# Patient Record
Sex: Male | Born: 1995 | Race: White | Hispanic: No | Marital: Single | Smoking: Never smoker
Health system: Southern US, Community
[De-identification: ages and names within clinical notes are randomized; demographics above are authoritative.]

## PROBLEM LIST (undated history)

## (undated) DIAGNOSIS — J45909 Unspecified asthma, uncomplicated: Secondary | ICD-10-CM

## (undated) HISTORY — PX: HIP SURGERY: SHX245

## (undated) HISTORY — PX: WISDOM TOOTH EXTRACTION: SHX21

---
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
## 2016-08-14 ENCOUNTER — Emergency Department (HOSPITAL_COMMUNITY)
Admission: EM | Admit: 2016-08-14 | Discharge: 2016-08-14 | Disposition: A | Discharge status: Other Acute Inpatient Hospital | Payer: BLUE CROSS/BLUE SHIELD | Attending: Physician Assistant | Admitting: Physician Assistant

## 2016-08-14 ENCOUNTER — Emergency Department (HOSPITAL_COMMUNITY): Payer: BLUE CROSS/BLUE SHIELD

## 2016-08-14 ENCOUNTER — Encounter (HOSPITAL_COMMUNITY): Payer: Self-pay

## 2016-08-14 DIAGNOSIS — Y999 Unspecified external cause status: Secondary | ICD-10-CM | POA: Diagnosis not present

## 2016-08-14 DIAGNOSIS — S80811A Abrasion, right lower leg, initial encounter: Secondary | ICD-10-CM | POA: Diagnosis not present

## 2016-08-14 DIAGNOSIS — M25551 Pain in right hip: Secondary | ICD-10-CM | POA: Insufficient documentation

## 2016-08-14 DIAGNOSIS — Z5181 Encounter for therapeutic drug level monitoring: Secondary | ICD-10-CM | POA: Diagnosis not present

## 2016-08-14 DIAGNOSIS — S73004A Unspecified dislocation of right hip, initial encounter: Secondary | ICD-10-CM | POA: Diagnosis not present

## 2016-08-14 DIAGNOSIS — J45909 Unspecified asthma, uncomplicated: Secondary | ICD-10-CM | POA: Insufficient documentation

## 2016-08-14 DIAGNOSIS — R52 Pain, unspecified: Secondary | ICD-10-CM

## 2016-08-14 DIAGNOSIS — Z79899 Other long term (current) drug therapy: Secondary | ICD-10-CM | POA: Diagnosis not present

## 2016-08-14 DIAGNOSIS — S0990XA Unspecified injury of head, initial encounter: Secondary | ICD-10-CM | POA: Diagnosis present

## 2016-08-14 DIAGNOSIS — Z23 Encounter for immunization: Secondary | ICD-10-CM | POA: Diagnosis not present

## 2016-08-14 DIAGNOSIS — Y939 Activity, unspecified: Secondary | ICD-10-CM | POA: Insufficient documentation

## 2016-08-14 DIAGNOSIS — Y92410 Unspecified street and highway as the place of occurrence of the external cause: Secondary | ICD-10-CM | POA: Diagnosis not present

## 2016-08-14 HISTORY — DX: Unspecified asthma, uncomplicated: J45.909

## 2016-08-14 LAB — CBC WITH DIFFERENTIAL/PLATELET
BASOS ABS: 0 10*3/uL (ref 0.0–0.1)
BASOS PCT: 0 %
EOS PCT: 2 %
Eosinophils Absolute: 0.2 10*3/uL (ref 0.0–0.7)
HCT: 43 % (ref 39.0–52.0)
Hemoglobin: 14.7 g/dL (ref 13.0–17.0)
LYMPHS PCT: 27 %
Lymphs Abs: 2.6 10*3/uL (ref 0.7–4.0)
MCH: 28 pg (ref 26.0–34.0)
MCHC: 34.2 g/dL (ref 30.0–36.0)
MCV: 81.9 fL (ref 78.0–100.0)
MONO ABS: 0.8 10*3/uL (ref 0.1–1.0)
Monocytes Relative: 8 %
Neutro Abs: 6 10*3/uL (ref 1.7–7.7)
Neutrophils Relative %: 63 %
Platelets: 225 10*3/uL (ref 150–400)
RBC: 5.25 MIL/uL (ref 4.22–5.81)
RDW: 13.2 % (ref 11.5–15.5)
WBC: 9.5 10*3/uL (ref 4.0–10.5)

## 2016-08-14 LAB — COMPREHENSIVE METABOLIC PANEL
ALBUMIN: 4.2 g/dL (ref 3.5–5.0)
ALT: 18 U/L (ref 17–63)
AST: 27 U/L (ref 15–41)
Alkaline Phosphatase: 59 U/L (ref 38–126)
Anion gap: 11 (ref 5–15)
BUN: 19 mg/dL (ref 6–20)
CHLORIDE: 105 mmol/L (ref 101–111)
CO2: 22 mmol/L (ref 22–32)
CREATININE: 1.06 mg/dL (ref 0.61–1.24)
Calcium: 9.1 mg/dL (ref 8.9–10.3)
GFR calc Af Amer: >60 mL/min (ref >60)
GLUCOSE: 121 mg/dL — AB (ref 65–99)
POTASSIUM: 3.4 mmol/L — AB (ref 3.5–5.1)
Sodium: 138 mmol/L (ref 135–145)
Total Bilirubin: 0.5 mg/dL (ref 0.3–1.2)
Total Protein: 6.7 g/dL (ref 6.5–8.1)

## 2016-08-14 LAB — PROTIME-INR
INR: 1.09
Prothrombin Time: 14.2 seconds (ref 11.4–15.2)

## 2016-08-14 MED ORDER — PROPOFOL 10 MG/ML IV BOLUS
1.0000 mg/kg | Freq: Once | INTRAVENOUS | Status: AC
  Administered 2016-08-14: 59 mg via INTRAVENOUS
  Filled 2016-08-14: qty 20

## 2016-08-14 MED ORDER — FENTANYL CITRATE (PF) 100 MCG/2ML IJ SOLN
INTRAMUSCULAR | Status: AC
  Filled 2016-08-14: qty 2

## 2016-08-14 MED ORDER — PROPOFOL BOLUS VIA INFUSION
1.0000 mg/kg | Freq: Once | INTRAVENOUS | Status: DC
  Filled 2016-08-14: qty 59

## 2016-08-14 MED ORDER — TETANUS-DIPHTH-ACELL PERTUSSIS 5-2.5-18.5 LF-MCG/0.5 IM SUSP
0.5000 mL | Freq: Once | INTRAMUSCULAR | Status: AC
  Administered 2016-08-15: 0.5 mL via INTRAMUSCULAR
  Filled 2016-08-14: qty 0.5

## 2016-08-14 MED ORDER — FENTANYL CITRATE (PF) 100 MCG/2ML IJ SOLN
INTRAMUSCULAR | Status: AC | PRN
  Administered 2016-08-14 (×2): 50 ug via INTRAVENOUS

## 2016-08-14 MED ORDER — PROPOFOL 10 MG/ML IV BOLUS
INTRAVENOUS | Status: AC | PRN
  Administered 2016-08-14: 10 mg via INTRAVENOUS
  Administered 2016-08-14 (×4): 30 mg via INTRAVENOUS

## 2016-08-14 NOTE — Progress Notes (Signed)
Orthopedic Tech Progress Note Patient Details:  Andria RheinRobert Manfre 12/20/1995 [id number]  Ortho Devices Type of Ortho Device: Knee Immobilizer Ortho Device/Splint Location: RLE Ortho Device/Splint Interventions: Ordered, Application   Jennye MoccasinHughes, Joan Avetisyan Craig 08/14/2016, 11:02 PM

## 2016-08-14 NOTE — Sedation Documentation (Signed)
Successful right hip reduction by Dr. Moody BruinsIrick and Dr. Corlis LeakMackuen, ortho at bedside placing a knee immobilizer, x ray at bedside
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
## 2016-08-14 NOTE — ED Notes (Signed)
Pt rolled onto his left side with blankets under him for support and comfort, pt is appreciative
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
## 2016-08-14 NOTE — ED Notes (Signed)
Transported to ct 

## 2016-08-14 NOTE — ED Notes (Signed)
REpaged ortho/XU 

## 2016-08-14 NOTE — ED Notes (Signed)
X-ray at bedside

## 2016-08-15 LAB — ETHANOL: Alcohol, Ethyl (B): <5 mg/dL

## 2016-08-15 MED ORDER — FENTANYL CITRATE (PF) 100 MCG/2ML IJ SOLN
50.0000 ug | Freq: Once | INTRAMUSCULAR | Status: AC
  Administered 2016-08-15: 50 ug via INTRAVENOUS
  Filled 2016-08-15: qty 2

## 2016-08-15 NOTE — ED Notes (Signed)
[facility] called for transport to [facility]- [facility]

## 2016-08-16 MED FILL — Fentanyl Citrate Preservative Free (PF) Inj 100 MCG/2ML: INTRAMUSCULAR | Qty: 2 | Status: AC

## 2017-02-04 ENCOUNTER — Emergency Department (HOSPITAL_BASED_OUTPATIENT_CLINIC_OR_DEPARTMENT_OTHER)
Admission: EM | Admit: 2017-02-04 | Discharge: 2017-02-04 | Disposition: A | Discharge status: Home / Self Care | Payer: BLUE CROSS/BLUE SHIELD | Attending: Emergency Medicine | Admitting: Emergency Medicine

## 2017-02-04 ENCOUNTER — Encounter (HOSPITAL_BASED_OUTPATIENT_CLINIC_OR_DEPARTMENT_OTHER): Payer: Self-pay

## 2017-02-04 DIAGNOSIS — Z5321 Procedure and treatment not carried out due to patient leaving prior to being seen by health care provider: Secondary | ICD-10-CM | POA: Insufficient documentation

## 2017-02-04 DIAGNOSIS — T7840XA Allergy, unspecified, initial encounter: Secondary | ICD-10-CM | POA: Diagnosis not present

## 2017-02-04 NOTE — ED Triage Notes (Addendum)
Pt states he had allergic reaction from tea this am approx 11am with swelling to upper lip-pt has taken benadryl  x 4 and 30cc liquid-pt states he woke from nap and felt like he could not breathe-pt NAD-anxious with hands shaking-no resp distress at this time and no swelling to lips noted-RT in triage for assessment

## 2017-03-17 ENCOUNTER — Emergency Department (HOSPITAL_COMMUNITY)
Admission: EM | Admit: 2017-03-17 | Discharge: 2017-03-17 | Disposition: A | Discharge status: Home / Self Care | Payer: Worker's Compensation | Attending: Emergency Medicine | Admitting: Emergency Medicine

## 2017-03-17 ENCOUNTER — Encounter (HOSPITAL_COMMUNITY): Payer: Self-pay

## 2017-03-17 ENCOUNTER — Emergency Department (HOSPITAL_COMMUNITY): Payer: Worker's Compensation

## 2017-03-17 DIAGNOSIS — Y939 Activity, unspecified: Secondary | ICD-10-CM | POA: Diagnosis not present

## 2017-03-17 DIAGNOSIS — Y929 Unspecified place or not applicable: Secondary | ICD-10-CM | POA: Insufficient documentation

## 2017-03-17 DIAGNOSIS — L600 Ingrowing nail: Secondary | ICD-10-CM

## 2017-03-17 DIAGNOSIS — W230XXA Caught, crushed, jammed, or pinched between moving objects, initial encounter: Secondary | ICD-10-CM | POA: Diagnosis not present

## 2017-03-17 DIAGNOSIS — S92411A Displaced fracture of proximal phalanx of right great toe, initial encounter for closed fracture: Secondary | ICD-10-CM | POA: Diagnosis not present

## 2017-03-17 DIAGNOSIS — Y990 Civilian activity done for income or pay: Secondary | ICD-10-CM | POA: Diagnosis not present

## 2017-03-17 DIAGNOSIS — S92401B Displaced unspecified fracture of right great toe, initial encounter for open fracture: Secondary | ICD-10-CM

## 2017-03-17 DIAGNOSIS — J45909 Unspecified asthma, uncomplicated: Secondary | ICD-10-CM | POA: Diagnosis not present

## 2017-03-17 MED ORDER — CEPHALEXIN 500 MG PO CAPS: 500.0000 mg | ORAL_CAPSULE | Freq: Four times a day (QID) | ORAL | 0 refills | Status: DC

## 2017-03-17 NOTE — ED Notes (Addendum)
Pt is alert and oriented x 4 and is accompanied with mother.Pt right big toe that is red and has scant bleeding  from nailbed, that appears to be split.  pt has pain with movement of toe 10/10 throbbing. Declined ice

## 2017-03-17 NOTE — ED Provider Notes (Signed)
[facility]-EMERGENCY DEPT Provider Note   CSN: [id number] Arrival date & time: 03/17/17  1013     History   Chief Complaint Chief Complaint  Patient presents with  . Foot Injury    HPI Timothy Ball is a 21 y.o. male.  Pt comes in with c/o right great toe pain after dropping a industrial battery on his toe at work a short time ago. He states that he already had an ingrown nail to the area but not the area is bleeding. Tetanus is utd. Denies numbness. No previous injury to the toe      Past Medical History:  Diagnosis Date  . Asthma     There are no active problems to display for this patient.   Past Surgical History:  Procedure Laterality Date  . HIP SURGERY         Home Medications    Prior to Admission medications   Medication Sig Start Date End Date Taking? Authorizing Provider  cephALEXin (KEFLEX) 500 MG capsule Take 1 capsule (500 mg total) by mouth 4 (four) times daily. 03/17/17   Teressa Lower, NP    Family History History reviewed. No pertinent family history.  Social History Social History  Substance Use Topics  . Smoking status: Never Smoker  . Smokeless tobacco: Never Used  . Alcohol use Yes     Comment: occ     Allergies   Peanut-containing drug products; Shellfish allergy; and Sulfites   Review of Systems Review of Systems  All other systems reviewed and are negative.    Physical Exam Updated Vital Signs BP 116/66 (BP Location: Left Arm)   Pulse 66   Temp 98.3 F (36.8 C) (Oral)   Resp 20   Ht 5\' 8"  (1.727 m)   Wt 59 kg (130 lb)   SpO2 100%   BMI 19.77 kg/m   Physical Exam  Constitutional: He is oriented to person, place, and time. He appears well-developed and well-nourished.  Cardiovascular: Normal rate.   Pulmonary/Chest: Effort normal.  Musculoskeletal:  Generalized pain to the right great toe. Mild swelling noted  Neurological: He is alert and oriented to person, place, and time.    Skin:  Overgrown skin noted on both side of right great toe.bleeding noted. No break in the nail  Nursing note and vitals reviewed.    ED Treatments / Results  Labs (all labs ordered are listed, but only abnormal results are displayed) Labs Reviewed - No data to display  EKG  EKG Interpretation None       Radiology Dg Foot Complete Right  Result Date: 03/17/2017 CLINICAL DATA:  Dropped heavy object on foot with pain, initial encounter EXAM: RIGHT FOOT COMPLETE - 3+ VIEW COMPARISON:  None. FINDINGS: Minimally displaced distal phalangeal tuft fracture is noted in the first distal phalanx. No other bony abnormality is seen. Mild soft tissue swelling is noted. IMPRESSION: Phalangeal tuft fracture in the first distal phalanx. Electronically Signed   By: Alcide Clever M.D.   On: 03/17/2017 10:47    Procedures Procedures (including critical care time)  Medications Ordered in ED Medications - No data to display   Initial Impression / Assessment and Plan / ED Course  I have reviewed the triage vital signs and the nursing notes.  Pertinent labs & imaging results that were available during my care of the patient were reviewed by me and considered in my medical decision making (see chart for details).     Pt refusing fixing  [place] nail. Will treat as possible open fracture. Splinted and treated with keflex  Final Clinical Impressions(s) / ED Diagnoses   Final diagnoses:  Open displaced fracture of phalanx of right great toe, unspecified phalanx, initial encounter  Ingrown nail of fifth toe of right foot    New Prescriptions Discharge Medication List as of 03/17/2017 11:39 AM    START taking these medications   Details  cephALEXin (KEFLEX) 500 MG capsule Take 1 capsule (500 mg total) by mouth 4 (four) times daily., Starting Mon 03/17/2017, Print         Rubin PayorPickering, Deliah BostonVrinda, NP 03/17/17 1221    Shaune PollackIsaacs, Cameron, MD 03/18/17 1149

## 2017-03-17 NOTE — ED Notes (Signed)
Buddy taped toes, applied post op shoe.

## 2017-03-17 NOTE — ED Triage Notes (Signed)
Per EMS, Pt, from work, c/o R foot injury after a battery fell on his foot.  Pain score 8/10.  Pt reports the battery weighs around 65lbs.  Toe nail is split on R great toe.

## 2017-03-17 NOTE — ED Notes (Signed)
Bed: WTR8 Expected date:  Expected time:  Means of arrival:  Comments: 21 yo m right foot injury

## 2017-05-08 ENCOUNTER — Other Ambulatory Visit: Payer: Self-pay | Admitting: Physician Assistant

## 2017-05-08 DIAGNOSIS — R1011 Right upper quadrant pain: Secondary | ICD-10-CM

## 2017-05-08 DIAGNOSIS — R1013 Epigastric pain: Secondary | ICD-10-CM

## 2017-05-15 ENCOUNTER — Ambulatory Visit
Admission: RE | Admit: 2017-05-15 | Discharge: 2017-05-15 | Disposition: A | Discharge status: Home / Self Care | Payer: BLUE CROSS/BLUE SHIELD | Source: Ambulatory Visit | Attending: Physician Assistant | Admitting: Physician Assistant

## 2017-05-15 DIAGNOSIS — R1013 Epigastric pain: Secondary | ICD-10-CM

## 2017-05-15 DIAGNOSIS — R1011 Right upper quadrant pain: Secondary | ICD-10-CM

## 2017-08-30 ENCOUNTER — Other Ambulatory Visit: Payer: Self-pay

## 2017-08-30 ENCOUNTER — Emergency Department (HOSPITAL_COMMUNITY)
Admission: EM | Admit: 2017-08-30 | Discharge: 2017-08-30 | Disposition: A | Discharge status: Home / Self Care | Payer: BLUE CROSS/BLUE SHIELD | Attending: Emergency Medicine | Admitting: Emergency Medicine

## 2017-08-30 ENCOUNTER — Encounter (HOSPITAL_COMMUNITY): Payer: Self-pay | Admitting: Emergency Medicine

## 2017-08-30 DIAGNOSIS — Z79899 Other long term (current) drug therapy: Secondary | ICD-10-CM | POA: Insufficient documentation

## 2017-08-30 DIAGNOSIS — T7840XA Allergy, unspecified, initial encounter: Secondary | ICD-10-CM

## 2017-08-30 DIAGNOSIS — J45909 Unspecified asthma, uncomplicated: Secondary | ICD-10-CM | POA: Insufficient documentation

## 2017-08-30 MED ORDER — FAMOTIDINE IN NACL 20-0.9 MG/50ML-% IV SOLN
20.0000 mg | Freq: Once | INTRAVENOUS | Status: AC
  Administered 2017-08-30: 20 mg via INTRAVENOUS
  Filled 2017-08-30: qty 50

## 2017-08-30 MED ORDER — SODIUM CHLORIDE 0.9 % IV SOLN
INTRAVENOUS | Status: DC
  Administered 2017-08-30: 12:00:00 via INTRAVENOUS

## 2017-08-30 MED ORDER — DIPHENHYDRAMINE HCL 25 MG PO TABS: 25.0000 mg | ORAL_TABLET | Freq: Four times a day (QID) | ORAL | 0 refills | Status: DC

## 2017-08-30 MED ORDER — FAMOTIDINE 20 MG PO TABS: 20.0000 mg | ORAL_TABLET | Freq: Two times a day (BID) | ORAL | 0 refills | Status: DC

## 2017-08-30 MED ORDER — PREDNISONE 10 MG PO TABS: 40.0000 mg | ORAL_TABLET | Freq: Every day | ORAL | 0 refills | Status: DC

## 2017-08-30 MED ORDER — SODIUM CHLORIDE 0.9 % IV BOLUS
1000.0000 mL | Freq: Once | INTRAVENOUS | Status: AC
  Administered 2017-08-30: 1000 mL via INTRAVENOUS

## 2017-08-30 MED ORDER — METHYLPREDNISOLONE SODIUM SUCC 125 MG IJ SOLR
125.0000 mg | Freq: Once | INTRAMUSCULAR | Status: AC
Start: 2017-08-30 — End: 2017-08-30
  Administered 2017-08-30: 125 mg via INTRAVENOUS
  Filled 2017-08-30: qty 2

## 2017-08-30 NOTE — ED Notes (Signed)
Patient verbalizes understanding of discharge instructions. Opportunity for questioning and answers were provided. Armband removed by staff, pt discharged from ED.  

## 2017-08-30 NOTE — ED Triage Notes (Signed)
Pt. Stated, I got up and ate some vanilla wafers and then my face got swollen and I took 2 Benadryl's and Im sorted SOB and its feels hard to swallow. This started at 1030 and its the same. I did get a tattoo yesterday and was given an ointment , that's the only thing new. Pt. Just stated, I think Im allergic to Benadryl

## 2017-08-30 NOTE — Discharge Instructions (Addendum)
Continue the prednisone for the next 5 days.  Continue Benadryl for the next 2 days and the Pepcid for the next 7 days.  Would avoid the cookies that seem to be related to today's reaction.  Return for any new or worse symptoms.

## 2017-08-30 NOTE — ED Provider Notes (Signed)
[facility] EMERGENCY [facility]PARTMENT Provider Note   CSN: [id number] Arrival date & time: 08/30/17  1058     History   Chief Complaint Chief Complaint  Patient presents with  . Allergic Reaction  . Shortness of Breath  . Dysphagia    HPI Timothy Ball is a 22 y.o. male.  Patient with known history of several allergies.  Was eating but no a cookie is which normally he does fine with and started to feel as if he had swelling to the right side of his face some swelling to the tongue and some difficulty swallowing.  Patient took 50 mg Benadryl at home prior to arrival.  Only thing else it is new patient had a tattoo placed yesterday and has been using a special skin cream for that but was using that yesterday without any difficulty.     Past Medical History:  Diagnosis Date  . Asthma     There are no active problems to display for this patient.   Past Surgical History:  Procedure Laterality Date  . HIP SURGERY          Home Medications    Prior to Admission medications   Medication Sig Start Date End Date Taking? Authorizing Provider  acetaminophen (TYLENOL) 500 MG tablet Take 1,000 mg by mouth as needed for mild pain.   Yes [provider]  albuterol (PROVENTIL HFA;VENTOLIN HFA) 108 (90 Base) MCG/ACT inhaler Inhale 1 puff into the lungs every 6 (six) hours as needed for shortness of breath or wheezing. 02/06/17  Yes [provider]  hydrOXYzine (ATARAX/VISTARIL) 25 MG tablet Take 25 mg by mouth every 8 (eight) hours as needed for anxiety. 08/25/17  Yes [provider]  omeprazole (PRILOSEC OTC) 20 MG tablet Take 20 mg by mouth daily.   Yes [provider]  QUEtiapine (SEROQUEL) 50 MG tablet Take 50 mg by mouth daily. 08/25/17  Yes [provider]  cephALEXin (KEFLEX) 500 MG capsule Take 1 capsule (500 mg total) by mouth 4 (four) times daily. Patient not taking: Reported on 08/30/2017 03/17/17   Teressa LowerPickering, Vrinda,  NP  diphenhydrAMINE (BENADRYL) 25 MG tablet Take 1 tablet (25 mg total) by mouth every 6 (six) hours for 2 days. 08/30/17 09/01/17  Vanetta MuldersZackowski, Jeniyah Menor, MD  famotidine (PEPCID) 20 MG tablet Take 1 tablet (20 mg total) by mouth 2 (two) times daily. 08/30/17   Vanetta MuldersZackowski, Hanson Medeiros, MD  predniSONE (DELTASONE) 10 MG tablet Take 4 tablets (40 mg total) by mouth daily. 08/30/17   Vanetta MuldersZackowski, Doneta Bayman, MD    Family History No family history on file.  Social History Social History   Tobacco Use  . Smoking status: Never Smoker  . Smokeless tobacco: Never Used  Substance Use Topics  . Alcohol use: Yes    Comment: occ  . Drug use: No     Allergies   Peanut oil; Peanut-containing drug products; Shellfish allergy; Prednisone; Sulfites; and Benadryl [diphenhydramine]   Review of Systems Review of Systems  Constitutional: Negative for fever.  HENT: Positive for facial swelling and trouble swallowing. Negative for congestion and voice change.   Eyes: Negative for redness.  Respiratory: Negative for shortness of breath and wheezing.   Cardiovascular: Negative for chest pain.  Gastrointestinal: Negative for abdominal pain, nausea and vomiting.  Musculoskeletal: Negative for back pain.  Skin: Negative for rash.  Neurological: Positive for facial asymmetry. Negative for syncope.  Hematological: Does not bruise/bleed easily.  Psychiatric/Behavioral: Negative for confusion.  Physical Exam Updated Vital Signs BP 111/68   Pulse 76   Temp (!) 97.5 F (36.4 C) (Oral)   Resp 14   Ht 1.753 m (5\' 9" )   Wt 56.7 kg (125 lb)   SpO2 100%   BMI 18.46 kg/m   Physical Exam  Constitutional: He is oriented to person, place, and time. He appears well-developed and well-nourished. No distress.  HENT:  Head: Normocephalic and atraumatic.  Mouth/Throat: Oropharynx is clear and moist.  No obvious facial swelling or tongue swelling.  Eyes: Pupils are equal, round, and reactive to light. Conjunctivae and EOM are  normal.  Neck: Normal range of motion. Neck supple.  Cardiovascular: Normal rate, regular rhythm and normal heart sounds.  Pulmonary/Chest: Effort normal and breath sounds normal. No respiratory distress. He has no wheezes.  Abdominal: Soft. Bowel sounds are normal. There is no tenderness.  Musculoskeletal: Normal range of motion. He exhibits no edema.  Neurological: He is alert and oriented to person, place, and time. No cranial nerve deficit or sensory deficit. He exhibits normal muscle tone. Coordination normal.  Skin: Skin is warm.  Nursing note and vitals reviewed.    ED Treatments / Results  Labs (all labs ordered are listed, but only abnormal results are displayed) Labs Reviewed - No data to display  EKG None  Radiology No results found.  Procedures Procedures (including critical care time)  Medications Ordered in ED Medications  0.9 %  sodium chloride infusion ( Intravenous New Bag/Given 08/30/17 1147)  sodium chloride 0.9 % bolus 1,000 mL (0 mLs Intravenous Stopped 08/30/17 1346)  methylPREDNISolone sodium succinate (SOLU-MEDROL) 125 mg/2 mL injection 125 mg (125 mg Intravenous Given 08/30/17 1145)  famotidine (PEPCID) IVPB 20 mg premix (0 mg Intravenous Stopped 08/30/17 1346)     Initial Impression / Assessment and Plan / ED Course  I have reviewed the triage vital signs and the nursing notes.  Pertinent labs & imaging results that were available during my care of the patient were reviewed by me and considered in my medical decision making (see chart for details).    Patient with known history of several allergies.  Patient believes that the reaction occurred while he was eating Salmonella cookies usually been able to eat those in the past.  He got a tight feeling in the throat felt as if his tongue is swollen some and and had some swelling to the right side of his face.  No hives.  Patient here received Solu-Medrol and Pepcid.  He already taken 50 mg of Benadryl at  home.  Patient now asymptomatic.  Never had any wheezing.  No visible or significant tongue swelling just subtext subjective tongue swelling.  Patient stable for discharge home.  Will take prednisone for the next 5 days.  Will take Pepcid for the next 7 days.  And will continue Benadryl for the next 2 days.  He will follow-up with his primary care provider.   Final Clinical Impressions(s) / ED Diagnoses   Final diagnoses:  Allergic reaction, initial encounter    ED Discharge Orders        Ordered    predniSONE (DELTASONE) 10 MG tablet  Daily     08/30/17 1416    diphenhydrAMINE (BENADRYL) 25 MG tablet  Every 6 hours     08/30/17 1416    famotidine (PEPCID) 20 MG tablet  2 times daily     08/30/17 1416       Vanetta Mulders, MD 08/30/17 1422

## 2018-01-08 ENCOUNTER — Encounter (HOSPITAL_COMMUNITY): Payer: Self-pay

## 2018-01-08 ENCOUNTER — Other Ambulatory Visit: Payer: Self-pay

## 2018-01-08 ENCOUNTER — Emergency Department (HOSPITAL_COMMUNITY)
Admission: EM | Admit: 2018-01-08 | Discharge: 2018-01-08 | Disposition: A | Discharge status: Home / Self Care | Payer: BLUE CROSS/BLUE SHIELD | Attending: Emergency Medicine | Admitting: Emergency Medicine

## 2018-01-08 DIAGNOSIS — Z79899 Other long term (current) drug therapy: Secondary | ICD-10-CM | POA: Insufficient documentation

## 2018-01-08 DIAGNOSIS — R070 Pain in throat: Secondary | ICD-10-CM | POA: Insufficient documentation

## 2018-01-08 DIAGNOSIS — R112 Nausea with vomiting, unspecified: Secondary | ICD-10-CM | POA: Insufficient documentation

## 2018-01-08 DIAGNOSIS — T887XXA Unspecified adverse effect of drug or medicament, initial encounter: Secondary | ICD-10-CM | POA: Diagnosis present

## 2018-01-08 DIAGNOSIS — F411 Generalized anxiety disorder: Secondary | ICD-10-CM

## 2018-01-08 DIAGNOSIS — Z91010 Allergy to peanuts: Secondary | ICD-10-CM | POA: Diagnosis not present

## 2018-01-08 DIAGNOSIS — F410 Panic disorder [episodic paroxysmal anxiety] without agoraphobia: Secondary | ICD-10-CM

## 2018-01-08 DIAGNOSIS — J45909 Unspecified asthma, uncomplicated: Secondary | ICD-10-CM | POA: Insufficient documentation

## 2018-01-08 DIAGNOSIS — Y658 Other specified misadventures during surgical and medical care: Secondary | ICD-10-CM | POA: Diagnosis not present

## 2018-01-08 DIAGNOSIS — T50905A Adverse effect of unspecified drugs, medicaments and biological substances, initial encounter: Secondary | ICD-10-CM | POA: Diagnosis not present

## 2018-01-08 DIAGNOSIS — R42 Dizziness and giddiness: Secondary | ICD-10-CM | POA: Diagnosis not present

## 2018-01-08 DIAGNOSIS — R0789 Other chest pain: Secondary | ICD-10-CM | POA: Diagnosis not present

## 2018-01-08 MED ORDER — ESCITALOPRAM OXALATE 10 MG PO TABS: 10.0000 mg | ORAL_TABLET | Freq: Every day | ORAL | 0 refills | Status: AC

## 2018-01-08 MED ORDER — HYDROXYZINE PAMOATE 50 MG PO CAPS: 50.0000 mg | ORAL_CAPSULE | Freq: Three times a day (TID) | ORAL | 0 refills | Status: AC | PRN

## 2018-01-08 NOTE — Discharge Instructions (Addendum)
I recommend that you stop taking the Buspirone (Buspar). I have placed this as an allergy in the chart.   As we discussed, the best treatment for anxiety are SSRIs (such as Zoloft, Lexapro, Prozac). These take at least 2-4 weeks to see a difference. I have prescribed you Lexapro 10mg  which you can take during the day or at night. I have also prescribed you Vistaril 50mg  which is for the acute anxiety attacks. This medication is best taken before the panic attack occurs. I recommend that you take this scheduled three times a day or a couple of hours before bedtime.  Call your PCP soon to schedule a follow-up appointment soon.

## 2018-01-08 NOTE — ED Provider Notes (Signed)
[facility]-EMERGENCY DEPT Provider Note  CSN: [id number] Arrival date & time: 01/08/18  1235  History   Chief Complaint Chief Complaint  Patient presents with  . Medication Reaction  . Emesis    HPI Timothy Ball is a 22 y.o. male with a medical history of asthma and GAD with panic attacks who presented to the ED for medication reaction. He was prescribed Buspar by his PCP yesterday 01/07/18 for anxiety. He reports nausea after taking first dose last night. After taking his morning dose today at work, he reports nausea, vomiting, dizziness and chest and throat tightness. Denies oral swelling, palpitations, abdominal pain, chest pain or skin rashes. Patient received IV Zofran in the EMS prior to arrival. Patient currently asymptomatic and has no current physical complaints.   Past Medical History:  Diagnosis Date  . Asthma     There are no active problems to display for this patient.   Past Surgical History:  Procedure Laterality Date  . HIP SURGERY    . WISDOM TOOTH EXTRACTION          Home Medications    Prior to Admission medications   Medication Sig Start Date End Date Taking? Authorizing Provider  acetaminophen (TYLENOL) 500 MG tablet Take 1,000 mg by mouth as needed for mild pain.   Yes [provider]  albuterol (PROVENTIL HFA;VENTOLIN HFA) 108 (90 Base) MCG/ACT inhaler Inhale 1 puff into the lungs every 6 (six) hours as needed for shortness of breath or wheezing. 02/06/17  Yes [provider]  busPIRone (BUSPAR) 15 MG tablet Take 15 mg by mouth 2 (two) times daily. 01/05/18  Yes [provider]  cephALEXin (KEFLEX) 500 MG capsule Take 1 capsule (500 mg total) by mouth 4 (four) times daily. Patient not taking: Reported on 08/30/2017 03/17/17   Teressa Lower, NP  diphenhydrAMINE (BENADRYL) 25 MG tablet Take 1 tablet (25 mg total) by mouth every 6 (six) hours for 2 days. Patient not taking: Reported on 01/08/2018 08/30/17  09/01/17  Vanetta Mulders, MD  escitalopram (LEXAPRO) 10 MG tablet Take 1 tablet (10 mg total) by mouth daily. 01/08/18 02/07/18  Sarh Kirschenbaum, Jerrel Ivory I, PA-C  famotidine (PEPCID) 20 MG tablet Take 1 tablet (20 mg total) by mouth 2 (two) times daily. Patient not taking: Reported on 01/08/2018 08/30/17   Vanetta Mulders, MD  hydrOXYzine (VISTARIL) 50 MG capsule Take 1 capsule (50 mg total) by mouth 3 (three) times daily as needed for up to 14 days. 01/08/18 01/22/18  Gwenda Heiner, Jerrel Ivory I, PA-C  predniSONE (DELTASONE) 10 MG tablet Take 4 tablets (40 mg total) by mouth daily. Patient not taking: Reported on 01/08/2018 08/30/17   Vanetta Mulders, MD    Family History History reviewed. No pertinent family history.  Social History Social History   Tobacco Use  . Smoking status: Never Smoker  . Smokeless tobacco: Never Used  Substance Use Topics  . Alcohol use: Yes    Comment: occ  . Drug use: No     Allergies   Buspirone; Peanut oil; Peanut-containing drug products; Shellfish allergy; Prednisone; Sulfites; and Benadryl [diphenhydramine]   Review of Systems Review of Systems  Constitutional: Negative for chills and fever.  HENT: Negative for congestion, facial swelling and sinus pain.        Throat tightness  Eyes: Negative for visual disturbance.  Respiratory: Positive for chest tightness. Negative for cough, choking and shortness of breath.   Cardiovascular: Negative for chest pain, palpitations and leg swelling.  Gastrointestinal: Positive  for nausea and vomiting. Negative for abdominal pain.  Genitourinary: Negative.   Musculoskeletal: Negative.   Skin: Negative for rash.  Neurological: Positive for dizziness and light-headedness. Negative for weakness and numbness.  Psychiatric/Behavioral: The patient is nervous/anxious.      Physical Exam Updated Vital Signs BP (!) 101/57 (BP Location: Right Arm)   Pulse 75   Temp 98.5 F (36.9 C) (Oral)   Resp 18   Ht 5' 8.5" (1.74 m)   Wt  59 kg   SpO2 100%   BMI 19.48 kg/m   Physical Exam  Constitutional: Vital signs are normal. He appears well-developed and well-nourished. He is cooperative. No distress.  HENT:  Head: Normocephalic and atraumatic.  Mouth/Throat: Uvula is midline, oropharynx is clear and moist and mucous membranes are normal.  Eyes: Pupils are equal, round, and reactive to light. Conjunctivae, EOM and lids are normal.  Neck: Trachea normal, normal range of motion, full passive range of motion without pain and phonation normal. Neck supple.  Cardiovascular: Normal rate, regular rhythm, normal heart sounds, intact distal pulses and normal pulses.  Pulmonary/Chest: Effort normal and breath sounds normal.  Abdominal: Soft. Normal appearance and bowel sounds are normal. There is no tenderness.  Neurological: He is alert.  Skin: Skin is warm and intact. Capillary refill takes less than 2 seconds. No rash noted. Rash is not urticarial.  Psychiatric: His speech is normal. His mood appears anxious.  Nursing note and vitals reviewed.  ED Treatments / Results  Labs (all labs ordered are listed, but only abnormal results are displayed) Labs Reviewed - No data to display  EKG None  Radiology No results found.  Procedures Procedures (including critical care time)  Medications Ordered in ED Medications - No data to display   Initial Impression / Assessment and Plan / ED Course  Triage vital signs and the nursing notes have been reviewed.  Pertinent labs & imaging results that were available during care of the patient were reviewed and considered in medical decision making (see chart for details).  Patient presents following a suspected allergic reaction to Buspar. Patient first began taking this medication yesterday from his PCP for anxiety and reports nausea, vomiting and chest tightness. Denies SOB, urticaria, oral swelling or other signs of anaphylaxis. He received Zofran prior to arrival and patient is  currently asymptomatic during evaluation. Physical exam is unremarkable which is reassuring.   Patient talks extensively about his anxiety and concerns for panic attacks. He describes daily attacks mostly at night where he has chest pain, increased worry, diaphoresis, dizziness and overwhelment. Education provided on medications indicated for panic attacks and GAD. He would likely benefit from SSRI plus an anxiolytic. He is not currently established with psychiatrist and past psych meds managed by PCP. He states that it takes 2-3 weeks for him to get an appointment with PCP. Patient reports trying Zoloft in the past, but was not consistent with it and did not max out dose. Will discharge patient with Lexapro (less GI side effects vs Zoloft) to begin treatment for anxiety since it takes 2-4 weeks for SSRIs to take effect which should bridge him to his next PCP appointment.   Final Clinical Impressions(s) / ED Diagnoses  1. Generalized Anxiety Disorder with Panic Attacks. Lexapro 10mg  QD for GAD. Vistaril 50mg  TID PRN for acute anxiety attacks. Education provided on efficacy of Lexapro, Vistaril and long-term effects. Advised to follow-up with PCP in 2-4 weeks to discuss effectiveness of medication. 2. Medication Reaction. Advised to  stop Buspar. Listed as allergy in chart.   Dispo: Home. After thorough clinical evaluation, this patient is determined to be medically stable and can be safely discharged with the previously mentioned treatment and/or outpatient follow-up/referral(s). At this time, there are no other apparent medical conditions that require further screening, evaluation or treatment.   Final diagnoses:  Adverse effect of drug, initial encounter  Generalized anxiety disorder with panic attacks    ED Discharge Orders         Ordered    escitalopram (LEXAPRO) 10 MG tablet  Daily     01/08/18 1628    hydrOXYzine (VISTARIL) 50 MG capsule  3 times daily PRN     01/08/18 1628             MortisSharyon Medicus, Kasson Lamere I, PA-C 01/08/18 2031    Virgina Norfolkuratolo, Adam, DO 01/09/18 [id number]

## 2018-01-08 NOTE — ED Triage Notes (Signed)
Patient reports tht he had an allergic reaction to Buspirone HCl. Patient states he last took at 1100 today. patient had a total of 2 tablets. Patient c/o vomiting, dizziness, and difficulty breathing.

## 2018-01-08 NOTE — ED Triage Notes (Signed)
He states he was rx with Buspar yesterday. Today has had several episodes of emesis. He rec'd. IV Zofran en route to hospital. He arrives in no distress.

## 2018-07-22 ENCOUNTER — Encounter (HOSPITAL_BASED_OUTPATIENT_CLINIC_OR_DEPARTMENT_OTHER): Payer: Self-pay

## 2018-07-22 ENCOUNTER — Emergency Department (HOSPITAL_BASED_OUTPATIENT_CLINIC_OR_DEPARTMENT_OTHER)
Admission: EM | Admit: 2018-07-22 | Discharge: 2018-07-23 | Disposition: A | Discharge status: Home / Self Care | Payer: BLUE CROSS/BLUE SHIELD | Attending: Emergency Medicine | Admitting: Emergency Medicine

## 2018-07-22 ENCOUNTER — Other Ambulatory Visit: Payer: Self-pay

## 2018-07-22 DIAGNOSIS — Z91010 Allergy to peanuts: Secondary | ICD-10-CM | POA: Insufficient documentation

## 2018-07-22 DIAGNOSIS — T782XXA Anaphylactic shock, unspecified, initial encounter: Secondary | ICD-10-CM | POA: Insufficient documentation

## 2018-07-22 DIAGNOSIS — J45909 Unspecified asthma, uncomplicated: Secondary | ICD-10-CM | POA: Insufficient documentation

## 2018-07-22 DIAGNOSIS — Z79899 Other long term (current) drug therapy: Secondary | ICD-10-CM | POA: Insufficient documentation

## 2018-07-22 DIAGNOSIS — R0602 Shortness of breath: Secondary | ICD-10-CM | POA: Diagnosis present

## 2018-07-22 MED ORDER — METHYLPREDNISOLONE SODIUM SUCC 125 MG IJ SOLR
125.0000 mg | Freq: Once | INTRAMUSCULAR | Status: AC
  Administered 2018-07-22: 125 mg via INTRAVENOUS
  Filled 2018-07-22: qty 2

## 2018-07-22 MED ORDER — RANITIDINE HCL 150 MG/10ML PO SYRP
150.0000 mg | ORAL_SOLUTION | Freq: Once | ORAL | Status: AC
  Administered 2018-07-22: 150 mg via ORAL
  Filled 2018-07-22: qty 10

## 2018-07-22 MED ORDER — DIPHENHYDRAMINE HCL 50 MG/ML IJ SOLN
25.0000 mg | Freq: Once | INTRAMUSCULAR | Status: AC
  Administered 2018-07-22: 25 mg via INTRAVENOUS
  Filled 2018-07-22: qty 1

## 2018-07-22 MED ORDER — EPINEPHRINE 0.3 MG/0.3ML IJ SOAJ
0.3000 mg | Freq: Once | INTRAMUSCULAR | Status: AC
  Administered 2018-07-22: 0.3 mg via INTRAMUSCULAR
  Filled 2018-07-22: qty 0.3

## 2018-07-22 NOTE — ED Notes (Signed)
Pt sleeping on stretcher. No hives noted. No SOB noted. Pt appears to be in NAD

## 2018-07-22 NOTE — ED Triage Notes (Addendum)
Pt states he is having throat tightness after drinking milk shake with m&m-allergy to peanuts-NAD-steady gait-pt did not use epi pen PTA

## 2018-07-22 NOTE — ED Notes (Signed)
Pt requesting top use the restroom. Pt refused urinal when offered. NAD at this time. VSS. Ambulatory to bathroom, gait equal and balanced.

## 2018-07-22 NOTE — ED Notes (Signed)
Pt given water for PO challenge 

## 2018-07-23 NOTE — ED Provider Notes (Signed)
[facility] EMERGENCY DEPARTMENT Provider Note   CSN: [id number] Arrival date & time: 07/22/18  2119    History   Chief Complaint Chief Complaint  Patient presents with  . Allergic Reaction    HPI Timothy Ball is a 23 y.o. male.     HPI   23 year old male with a history of asthma, anaphylaxis to peanuts, presents with concern for shortness of breath and throat swelling after having an m and m milkshake at [employer].    Patient reports that he had a couple sips of the milkshake, and suddenly developed sensation of shortness of breath and throat swelling.  Denies rash, nausea, vomiting, lightheadedness, change in voice.  Reports that he does have EpiPen's at home but he did not use it.  Reports in addition to the symptoms of shortness of breath, he does feel anxious with perioral tingling.  Past Medical History:  Diagnosis Date  . Asthma     There are no active problems to display for this patient.   Past Surgical History:  Procedure Laterality Date  . HIP SURGERY    . WISDOM TOOTH EXTRACTION          Home Medications    Prior to Admission medications   Medication Sig Start Date End Date Taking? Authorizing Provider  acetaminophen (TYLENOL) 500 MG tablet Take 1,000 mg by mouth as needed for mild pain.    [provider]  albuterol (PROVENTIL HFA;VENTOLIN HFA) 108 (90 Base) MCG/ACT inhaler Inhale 1 puff into the lungs every 6 (six) hours as needed for shortness of breath or wheezing. 02/06/17   [provider]  busPIRone (BUSPAR) 15 MG tablet Take 15 mg by mouth 2 (two) times daily. 01/05/18   [provider]  cephALEXin (KEFLEX) 500 MG capsule Take 1 capsule (500 mg total) by mouth 4 (four) times daily. Patient not taking: Reported on 08/30/2017 03/17/17   Teressa Lower, NP  diphenhydrAMINE (BENADRYL) 25 MG tablet Take 1 tablet (25 mg total) by mouth every 6 (six) hours for 2 days. Patient not taking: Reported on  01/08/2018 08/30/17 09/01/17  Vanetta Mulders, MD  escitalopram (LEXAPRO) 10 MG tablet Take 1 tablet (10 mg total) by mouth daily. 01/08/18 02/07/18  Mortis, Jerrel Ivory I, PA-C  famotidine (PEPCID) 20 MG tablet Take 1 tablet (20 mg total) by mouth 2 (two) times daily. Patient not taking: Reported on 01/08/2018 08/30/17   Vanetta Mulders, MD  predniSONE (DELTASONE) 10 MG tablet Take 4 tablets (40 mg total) by mouth daily. Patient not taking: Reported on 01/08/2018 08/30/17   Vanetta Mulders, MD    Family History No family history on file.  Social History Social History   Tobacco Use  . Smoking status: Never Smoker  . Smokeless tobacco: Never Used  Substance Use Topics  . Alcohol use: Yes    Comment: occ  . Drug use: No     Allergies   Buspirone; Peanut oil; Peanut-containing drug products; Shellfish allergy; Prednisone; Sulfites; and Benadryl [diphenhydramine]   Review of Systems Review of Systems  Constitutional: Negative for fever.  HENT: Positive for trouble swallowing. Negative for sore throat and voice change.   Eyes: Negative for visual disturbance.  Respiratory: Positive for shortness of breath.   Cardiovascular: Negative for chest pain.  Gastrointestinal: Negative for abdominal pain, nausea and vomiting.  Genitourinary: Negative for difficulty urinating.  Musculoskeletal: Negative for back pain and neck stiffness.  Skin: Negative for rash.  Neurological: Negative for syncope and headaches.  Physical Exam Updated Vital Signs BP 108/68   Pulse (!) 51   Resp 15   Ht 5\' 8"  (1.727 m)   Wt 61.1 kg   SpO2 98%   BMI 20.48 kg/m   Physical Exam Vitals signs and nursing note reviewed.  Constitutional:      General: He is not in acute distress.    Appearance: He is well-developed. He is not diaphoretic.     Comments: anxious  HENT:     Head: Normocephalic and atraumatic.  Eyes:     Conjunctiva/sclera: Conjunctivae normal.  Neck:     Musculoskeletal: Normal  range of motion.  Cardiovascular:     Rate and Rhythm: Normal rate and regular rhythm.     Heart sounds: Normal heart sounds. No murmur. No friction rub. No gallop.   Pulmonary:     Effort: Pulmonary effort is normal. Tachypnea present. No respiratory distress.     Breath sounds: Normal breath sounds. No wheezing or rales.     Comments: No stridor Abdominal:     General: There is no distension.     Palpations: Abdomen is soft.     Tenderness: There is no abdominal tenderness. There is no guarding.  Skin:    General: Skin is warm and dry.     Comments: No rash  Neurological:     Mental Status: He is alert and oriented to person, place, and time.      ED Treatments / Results  Labs (all labs ordered are listed, but only abnormal results are displayed) Labs Reviewed - No data to display  EKG None  Radiology No results found.  Procedures .Critical Care Performed by: Alvira Monday, MD Authorized by: Alvira Monday, MD   Critical care provider statement:    Critical care time (minutes):  30   Critical care was time spent personally by me on the following activities:  Examination of patient, re-evaluation of patient's condition, obtaining history from patient or surrogate and development of treatment plan with patient or surrogate   (including critical care time)  Medications Ordered in ED Medications  EPINEPHrine (EPI-PEN) injection 0.3 mg (0.3 mg Intramuscular Given 07/22/18 2204)  diphenhydrAMINE (BENADRYL) injection 25 mg (25 mg Intravenous Given 07/22/18 2155)  ranitidine (ZANTAC) 150 MG/10ML syrup 150 mg (150 mg Oral Given 07/22/18 2155)  methylPREDNISolone sodium succinate (SOLU-MEDROL) 125 mg/2 mL injection 125 mg (125 mg Intravenous Given 07/22/18 2155)     Initial Impression / Assessment and Plan / ED Course  I have reviewed the triage vital signs and the nursing notes.  Pertinent labs & imaging results that were available during my care of the patient were  reviewed by me and considered in my medical decision making (see chart for details).        23 year old male with a history of asthma, anaphylaxis to peanuts, presents with concern for shortness of breath and throat swelling after having an m and m milkshake at [employer].  Possible contamination with peanuts with milkshake, and symptoms described concerning for anaphylaxis given dyspnea, sensation of throat swelling.   Given epi, benadryl, solumedrol, zantac with improvement in symptoms.  Will observe for 4 hours in the ED, if no rebound of symptoms may be discharged.  He does report previous side effects with steroids/benadryl, however in setting of initial presentation felt benefits outweigh potential side effects, however do not feel he requires prescription to take these at home.  He also already has rx and epi pens. Care signed out  with reeval pending.    Final Clinical Impressions(s) / ED Diagnoses   Final diagnoses:  Anaphylaxis, initial encounter    ED Discharge Orders    None       Alvira Monday, MD 07/23/18 [phone]

## 2018-07-23 NOTE — ED Notes (Signed)
Pt denies any SOB, sore throat or allergic reaction symptoms

## 2018-07-31 ENCOUNTER — Emergency Department (HOSPITAL_BASED_OUTPATIENT_CLINIC_OR_DEPARTMENT_OTHER): Payer: BLUE CROSS/BLUE SHIELD

## 2018-07-31 ENCOUNTER — Emergency Department (HOSPITAL_BASED_OUTPATIENT_CLINIC_OR_DEPARTMENT_OTHER)
Admission: EM | Admit: 2018-07-31 | Discharge: 2018-07-31 | Disposition: A | Discharge status: Home / Self Care | Payer: BLUE CROSS/BLUE SHIELD | Attending: Emergency Medicine | Admitting: Emergency Medicine

## 2018-07-31 ENCOUNTER — Other Ambulatory Visit: Payer: Self-pay

## 2018-07-31 ENCOUNTER — Encounter (HOSPITAL_BASED_OUTPATIENT_CLINIC_OR_DEPARTMENT_OTHER): Payer: Self-pay

## 2018-07-31 DIAGNOSIS — Z91010 Allergy to peanuts: Secondary | ICD-10-CM | POA: Diagnosis not present

## 2018-07-31 DIAGNOSIS — J45909 Unspecified asthma, uncomplicated: Secondary | ICD-10-CM | POA: Insufficient documentation

## 2018-07-31 DIAGNOSIS — Z79899 Other long term (current) drug therapy: Secondary | ICD-10-CM | POA: Insufficient documentation

## 2018-07-31 DIAGNOSIS — R05 Cough: Secondary | ICD-10-CM | POA: Insufficient documentation

## 2018-07-31 DIAGNOSIS — R059 Cough, unspecified: Secondary | ICD-10-CM

## 2018-07-31 LAB — INFLUENZA PANEL BY PCR (TYPE A & B)
INFLAPCR: NEGATIVE
INFLBPCR: NEGATIVE

## 2018-07-31 LAB — GROUP A STREP BY PCR: Group A Strep by PCR: NOT DETECTED

## 2018-07-31 MED ORDER — BENZONATATE 100 MG PO CAPS: 100.0000 mg | ORAL_CAPSULE | Freq: Three times a day (TID) | ORAL | 0 refills | Status: DC

## 2018-07-31 MED ORDER — FLUTICASONE PROPIONATE 50 MCG/ACT NA SUSP: 1.0000 | Freq: Every day | NASAL | 0 refills | Status: DC

## 2018-07-31 MED ORDER — NAPROXEN 500 MG PO TABS: 500.0000 mg | ORAL_TABLET | Freq: Two times a day (BID) | ORAL | 0 refills | Status: DC

## 2018-07-31 MED ORDER — IPRATROPIUM-ALBUTEROL 0.5-2.5 (3) MG/3ML IN SOLN: 3.0000 mL | Freq: Once | RESPIRATORY_TRACT | Status: DC

## 2018-07-31 MED ORDER — ALBUTEROL SULFATE HFA 108 (90 BASE) MCG/ACT IN AERS
1.0000 | INHALATION_SPRAY | Freq: Once | RESPIRATORY_TRACT | Status: AC
  Administered 2018-07-31: 2 via RESPIRATORY_TRACT
  Filled 2018-07-31: qty 6.7

## 2018-07-31 NOTE — ED Provider Notes (Signed)
[facility] EMERGENCY DEPARTMENT Provider Note   CSN: [id number] Arrival date & time: 07/31/18  1131    History   Chief Complaint Chief Complaint  Patient presents with  . Cough    HPI Timothy Ball is a 23 y.o. male with a history of asthma who presents to the ED with complaints of URI/LRI sxs x 1.5 weeks. Patient reports congestion, bilateral ear pain, sore throat, cough productive of green mucous sputum, and at times he feels a bit short of breath with wheezing. He has a hx of asthma, has access to an inhaler at home, but has not been using it. He states sxs are constant without alleviating/aggravating factors. He notes subjective fevers, has not taken a temperature. Denies chest pain, emesis, diarrhea, or abdominal pain.  Denies recent foreign travel, sick contacts, or exposure to anyone formally diagnosed with coronavirus.       HPI  Past Medical History:  Diagnosis Date  . Asthma     There are no active problems to display for this patient.   Past Surgical History:  Procedure Laterality Date  . HIP SURGERY    . WISDOM TOOTH EXTRACTION          Home Medications    Prior to Admission medications   Medication Sig Start Date End Date Taking? Authorizing Provider  acetaminophen (TYLENOL) 500 MG tablet Take 1,000 mg by mouth as needed for mild pain.    [provider]  albuterol (PROVENTIL HFA;VENTOLIN HFA) 108 (90 Base) MCG/ACT inhaler Inhale 1 puff into the lungs every 6 (six) hours as needed for shortness of breath or wheezing. 02/06/17   [provider]  busPIRone (BUSPAR) 15 MG tablet Take 15 mg by mouth 2 (two) times daily. 01/05/18   [provider]  escitalopram (LEXAPRO) 10 MG tablet Take 1 tablet (10 mg total) by mouth daily. 01/08/18 02/07/18  Mortis, Sharyon Medicus, PA-C    Family History No family history on file.  Social History Social History   Tobacco Use  . Smoking status: Never Smoker  . Smokeless tobacco:  Never Used  Substance Use Topics  . Alcohol use: Yes    Comment: occ  . Drug use: No     Allergies   Buspirone; Peanut oil; Peanut-containing drug products; Shellfish allergy; Prednisone; Sulfites; and Benadryl [diphenhydramine]   Review of Systems Review of Systems  Constitutional: Positive for fever (subjective).  HENT: Positive for congestion, ear pain, rhinorrhea and sore throat. Negative for trouble swallowing and voice change.   Respiratory: Positive for cough, shortness of breath and wheezing.   Cardiovascular: Negative for chest pain.  Gastrointestinal: Negative for abdominal pain, diarrhea and vomiting.     Physical Exam Updated Vital Signs BP 104/80 (BP Location: Left Arm)   Pulse 64   Temp 98.2 F (36.8 C) (Oral)   Resp 18   Ht 5\' 9"  (1.753 m)   Wt 59 kg   SpO2 100%   BMI 19.20 kg/m   Physical Exam Vitals signs and nursing note reviewed.  Constitutional:      General: He is not in acute distress.    Appearance: He is well-developed. He is not toxic-appearing.  HENT:     Head: Normocephalic and atraumatic.     Comments: No sinus tenderness.     Right Ear: Ear canal and external ear normal. Tympanic membrane is not perforated, erythematous, retracted or bulging.     Left Ear: Ear canal and external ear normal. Tympanic membrane is  not perforated, erythematous, retracted or bulging.     Ears:     Comments: Mild amount of fluid present behind bilateral TMs, no loss of landmarks.  No mastoid erythema, warmth, or swelling.  Mastoids are nontender to palpation.    Nose: Congestion present.     Right Sinus: No maxillary sinus tenderness or frontal sinus tenderness.     Left Sinus: No maxillary sinus tenderness or frontal sinus tenderness.     Mouth/Throat:     Pharynx: Uvula midline. Posterior oropharyngeal erythema present. No oropharyngeal exudate.     Comments: Posterior oropharynx is symmetric appearing. Patient tolerating own secretions without difficulty.  No trismus. No drooling. No hot potato voice. No swelling beneath the tongue, submandibular compartment is soft.  Eyes:     General:        Right eye: No discharge.        Left eye: No discharge.     Conjunctiva/sclera: Conjunctivae normal.  Neck:     Musculoskeletal: Normal range of motion and neck supple. No neck rigidity.  Cardiovascular:     Rate and Rhythm: Normal rate and regular rhythm.  Pulmonary:     Effort: Pulmonary effort is normal. No respiratory distress.     Breath sounds: Normal breath sounds. No wheezing, rhonchi or rales.  Abdominal:     General: There is no distension.     Palpations: Abdomen is soft.     Tenderness: There is no abdominal tenderness.  Lymphadenopathy:     Cervical: Cervical adenopathy (mild anterior) present.  Skin:    General: Skin is warm and dry.     Findings: No rash.  Neurological:     Mental Status: He is alert.     Comments: Clear speech.   Psychiatric:        Behavior: Behavior normal.      ED Treatments / Results  Labs (all labs ordered are listed, but only abnormal results are displayed) Labs Reviewed - No data to display  EKG None  Radiology No results found.  Procedures Procedures (including critical care time)  Medications Ordered in ED Medications - No data to display   Initial Impression / Assessment and Plan / ED Course  I have reviewed the triage vital signs and the nursing notes.  Pertinent labs & imaging results that were available during my care of the patient were reviewed by me and considered in my medical decision making (see chart for details).  Patient presents to the emergency department with URI/LRI symptoms x 1.5 weeks. Nontoxic appearing, in no apparent distress, vitals WNL. RN spoke with infection prevention- patient does not appear to be a candidate for coronavirus testing, did recommend testing for influenza. Patient is afebrile in the ED, lungs are CTA, CXR negative for infiltrate, doubt  pneumonia. There is no wheezing or signs of respiratory distress, given inhaler without much change- do not feel presentation is consistent with asthma exacerbation requiring steroids at this time- patient agrees.  Patient afebrile, no sinus tenderness, doubt acute bacterial sinusitis. Strep negative- not consistent with RPA/PTA. No evidence of AOM on exam. No meningeal signs. Suspect viral vs allergic etiology at this time and will treat supportively with Naproxen, Flonase, and Tessalon. I discussed results, treatment plan, need for PCP follow-up, and return precautions with the patient. Provided opportunity for questions, patient confirmed understanding and is in agreement with plan.    Final Clinical Impressions(s) / ED Diagnoses   Final diagnoses:  Cough    ED Discharge Orders  Ordered    naproxen (NAPROSYN) 500 MG tablet  2 times daily     07/31/18 1436    fluticasone (FLONASE) 50 MCG/ACT nasal spray  Daily     07/31/18 1436    benzonatate (TESSALON) 100 MG capsule  Every 8 hours     07/31/18 1436           Abbagale Goguen, [place] R, PA-C 07/31/18 1438    Gwyneth Sprout, MD 07/31/18 2050

## 2018-07-31 NOTE — ED Triage Notes (Signed)
C/o flu like sx x 1.5 weeks-NAD-steady gait 

## 2018-07-31 NOTE — Discharge Instructions (Addendum)
You were seen in the emergency today for upper respiratory symptoms, we suspect your symptoms are related to allergies or a virus at this time.  Your chest xray was normal. Your strep test was negative. We are sending you home with the following medicines:   -Flonase to be used 1 spray in each nostril daily.  This medication is used to treat your congestion.  -Tessalon can be taken once every 8 hours as needed.  This medication is used to treat your cough.  - Naproxen is a nonsteroidal anti-inflammatory medication that will help with pain and swelling. Be sure to take this medication as prescribed with food, 1 pill every 12 hours,  It should be taken with food, as it can cause stomach upset, and more seriously, stomach bleeding. Do not take other nonsteroidal anti-inflammatory medications with this such as Advil, Motrin, Aleve, Mobic, Goodie Powder, or Motrin.    - Albuterol inhaler- use 1-2 puffs every 4-6 hours for shortness of breath/wheezing.   You make take Tylenol per over the counter dosing with these medications.   We have prescribed you new medication(s) today. Discuss the medications prescribed today with your pharmacist as they can have adverse effects and interactions with your other medicines including over the counter and prescribed medications. Seek medical evaluation if you start to experience new or abnormal symptoms after taking one of these medicines, seek care immediately if you start to experience difficulty breathing, feeling of your throat closing, facial swelling, or rash as these could be indications of a more serious allergic reaction  You will need to follow-up with your primary care provider within 2-3 days if your symptoms have not improved.  If you do not have a primary care provider one is provided in your discharge instructions.  Return to the emergency department for any new or worsening symptoms including but not limited to persistent fever for 5 days, difficulty  breathing, chest pain, rashes, passing out, or any other concerns.

## 2018-10-31 ENCOUNTER — Other Ambulatory Visit: Payer: Self-pay

## 2018-10-31 ENCOUNTER — Emergency Department (HOSPITAL_BASED_OUTPATIENT_CLINIC_OR_DEPARTMENT_OTHER): Payer: BC Managed Care – PPO

## 2018-10-31 ENCOUNTER — Emergency Department (HOSPITAL_BASED_OUTPATIENT_CLINIC_OR_DEPARTMENT_OTHER)
Admission: EM | Admit: 2018-10-31 | Discharge: 2018-10-31 | Disposition: A | Discharge status: Home / Self Care | Payer: BC Managed Care – PPO | Attending: Emergency Medicine | Admitting: Emergency Medicine

## 2018-10-31 ENCOUNTER — Encounter (HOSPITAL_BASED_OUTPATIENT_CLINIC_OR_DEPARTMENT_OTHER): Payer: Self-pay

## 2018-10-31 DIAGNOSIS — Z79899 Other long term (current) drug therapy: Secondary | ICD-10-CM | POA: Insufficient documentation

## 2018-10-31 DIAGNOSIS — W57XXXA Bitten or stung by nonvenomous insect and other nonvenomous arthropods, initial encounter: Secondary | ICD-10-CM | POA: Insufficient documentation

## 2018-10-31 DIAGNOSIS — Y999 Unspecified external cause status: Secondary | ICD-10-CM | POA: Diagnosis not present

## 2018-10-31 DIAGNOSIS — J45909 Unspecified asthma, uncomplicated: Secondary | ICD-10-CM | POA: Insufficient documentation

## 2018-10-31 DIAGNOSIS — Y9389 Activity, other specified: Secondary | ICD-10-CM | POA: Diagnosis not present

## 2018-10-31 DIAGNOSIS — Z91010 Allergy to peanuts: Secondary | ICD-10-CM | POA: Insufficient documentation

## 2018-10-31 DIAGNOSIS — Y929 Unspecified place or not applicable: Secondary | ICD-10-CM | POA: Diagnosis not present

## 2018-10-31 DIAGNOSIS — R519 Headache, unspecified: Secondary | ICD-10-CM

## 2018-10-31 DIAGNOSIS — S30860A Insect bite (nonvenomous) of lower back and pelvis, initial encounter: Secondary | ICD-10-CM | POA: Insufficient documentation

## 2018-10-31 DIAGNOSIS — R51 Headache: Secondary | ICD-10-CM | POA: Insufficient documentation

## 2018-10-31 LAB — CBC WITH DIFFERENTIAL/PLATELET
Abs Immature Granulocytes: 0.02 10*3/uL (ref 0.00–0.07)
Basophils Absolute: 0 10*3/uL (ref 0.0–0.1)
Basophils Relative: 0 %
Eosinophils Absolute: 0.3 10*3/uL (ref 0.0–0.5)
Eosinophils Relative: 4 %
HCT: 46 % (ref 39.0–52.0)
Hemoglobin: 15.2 g/dL (ref 13.0–17.0)
Immature Granulocytes: 0 %
Lymphocytes Relative: 21 %
Lymphs Abs: 1.5 10*3/uL (ref 0.7–4.0)
MCH: 27.9 pg (ref 26.0–34.0)
MCHC: 33 g/dL (ref 30.0–36.0)
MCV: 84.6 fL (ref 80.0–100.0)
Monocytes Absolute: 0.7 10*3/uL (ref 0.1–1.0)
Monocytes Relative: 10 %
Neutro Abs: 4.5 10*3/uL (ref 1.7–7.7)
Neutrophils Relative %: 65 %
Platelets: 181 10*3/uL (ref 150–400)
RBC: 5.44 MIL/uL (ref 4.22–5.81)
RDW: 12.9 % (ref 11.5–15.5)
WBC: 7 10*3/uL (ref 4.0–10.5)
nRBC: 0 % (ref 0.0–0.2)

## 2018-10-31 LAB — COMPREHENSIVE METABOLIC PANEL
ALT: 11 U/L (ref 0–44)
AST: 15 U/L (ref 15–41)
Albumin: 4.1 g/dL (ref 3.5–5.0)
Alkaline Phosphatase: 56 U/L (ref 38–126)
Anion gap: 8 (ref 5–15)
BUN: 17 mg/dL (ref 6–20)
CO2: 26 mmol/L (ref 22–32)
Calcium: 9.2 mg/dL (ref 8.9–10.3)
Chloride: 109 mmol/L (ref 98–111)
Creatinine, Ser: 1.4 mg/dL — ABNORMAL HIGH (ref 0.61–1.24)
GFR calc Af Amer: >60 mL/min (ref >60)
GFR calc non Af Amer: >60 mL/min (ref >60)
Glucose, Bld: 81 mg/dL (ref 70–99)
Potassium: 3.3 mmol/L — ABNORMAL LOW (ref 3.5–5.1)
Sodium: 143 mmol/L (ref 135–145)
Total Bilirubin: 1 mg/dL (ref 0.3–1.2)
Total Protein: 7.2 g/dL (ref 6.5–8.1)

## 2018-10-31 MED ORDER — PROCHLORPERAZINE EDISYLATE 10 MG/2ML IJ SOLN
10.0000 mg | Freq: Once | INTRAMUSCULAR | Status: AC
  Administered 2018-10-31: 10 mg via INTRAVENOUS
  Filled 2018-10-31: qty 2

## 2018-10-31 MED ORDER — LACTATED RINGERS IV BOLUS
1000.0000 mL | Freq: Once | INTRAVENOUS | Status: AC
  Administered 2018-10-31: 18:00:00 1000 mL via INTRAVENOUS

## 2018-10-31 NOTE — ED Notes (Signed)
ED Provider at bedside. 

## 2018-10-31 NOTE — ED Notes (Signed)
Pt also reports removing a tick on Sunday and symptoms began after.

## 2018-10-31 NOTE — ED Provider Notes (Signed)
[facility] EMERGENCY DEPARTMENT Provider Note   CSN: [id number] Arrival date & time: 10/31/18  1721    History   Chief Complaint Chief Complaint  Patient presents with  . Migraine    HPI Timothy Ball is a 23 y.o. male.     HPI  23 year old male presents with progressively worsening headache.  Started on 6/8.  Headache started occipital and was not that bad.  However since 6/9 has become progressively worse.  He has had nausea and poor appetite.  States food does not taste well.  No fevers.  No vomiting.  No focal weakness or numbness.  Went to urgent care on 6/10 and was given a Toradol shot with no help.  He is tried some Tylenol, most recently this morning.  He has had on and off blurry vision where it seems like things are still moving but there is no double vision.  This occurs mostly at night.  He is also felt dizzy.  Pain goes down into his neck.  No rash noted.  He states that he took a tick off of his "butt crack" on 6/7.  He went four wheeling with friends on 6/6 and 6/7 so is not sure exactly how long it was on.  However it did seem swollen.  He has not noticed a rash there or anywhere else.  Past Medical History:  Diagnosis Date  . Asthma     There are no active problems to display for this patient.   Past Surgical History:  Procedure Laterality Date  . HIP SURGERY    . WISDOM TOOTH EXTRACTION          Home Medications    Prior to Admission medications   Medication Sig Start Date End Date Taking? Authorizing Provider  acetaminophen (TYLENOL) 500 MG tablet Take 1,000 mg by mouth as needed for mild pain.    [provider]  albuterol (PROVENTIL HFA;VENTOLIN HFA) 108 (90 Base) MCG/ACT inhaler Inhale 1 puff into the lungs every 6 (six) hours as needed for shortness of breath or wheezing. 02/06/17   [provider]  benzonatate (TESSALON) 100 MG capsule Take 1 capsule (100 mg total) by mouth every 8 (eight) hours. 07/31/18   Petrucelli,  Samantha R, PA-C  busPIRone (BUSPAR) 15 MG tablet Take 15 mg by mouth 2 (two) times daily. 01/05/18   [provider]  escitalopram (LEXAPRO) 10 MG tablet Take 1 tablet (10 mg total) by mouth daily. 01/08/18 02/07/18  Mortis, Jerrel IvoryGabrielle I, PA-C  fluticasone (FLONASE) 50 MCG/ACT nasal spray Place 1 spray into both nostrils daily. 07/31/18   Petrucelli, Samantha R, PA-C  naproxen (NAPROSYN) 500 MG tablet Take 1 tablet (500 mg total) by mouth 2 (two) times daily. 07/31/18   Petrucelli, Pleas KochSamantha R, PA-C    Family History No family history on file.  Social History Social History   Tobacco Use  . Smoking status: Never Smoker  . Smokeless tobacco: Never Used  Substance Use Topics  . Alcohol use: Yes    Comment: occ  . Drug use: No     Allergies   Buspirone, Peanut oil, Peanut-containing drug products, Shellfish allergy, Prednisone, Sulfites, and Benadryl [diphenhydramine]   Review of Systems Review of Systems  Constitutional: Negative for fever.  Eyes: Positive for photophobia and visual disturbance.  Gastrointestinal: Positive for nausea. Negative for vomiting.  Musculoskeletal: Positive for neck pain.  Neurological: Positive for dizziness and headaches.  All other systems reviewed and are negative.  Physical Exam Updated Vital Signs BP 126/78   Pulse 72   Temp (!) 97.2 F (36.2 C) (Oral)   Resp 18   Ht 5\' 8"  (1.727 m)   Wt 61.2 kg   SpO2 100%   BMI 20.53 kg/m   Physical Exam Vitals signs and nursing note reviewed.  Constitutional:      General: He is not in acute distress.    Appearance: He is well-developed. He is not ill-appearing or diaphoretic.  HENT:     Head: Normocephalic and atraumatic.     Right Ear: External ear normal.     Left Ear: External ear normal.     Nose: Nose normal.  Eyes:     General:        Right eye: No discharge.        Left eye: No discharge.     Extraocular Movements: Extraocular movements intact.     Pupils: Pupils are  equal, round, and reactive to light.  Neck:     Musculoskeletal: Normal range of motion and neck supple. No neck rigidity.  Cardiovascular:     Rate and Rhythm: Regular rhythm. Bradycardia present.     Heart sounds: Normal heart sounds.  Pulmonary:     Effort: Pulmonary effort is normal.     Breath sounds: Normal breath sounds.  Abdominal:     Palpations: Abdomen is soft.     Tenderness: There is no abdominal tenderness.  Skin:    General: Skin is warm and dry.     Comments: No rash noted at buttocks where tick bite was. No other rash, including palms/soles  Neurological:     Mental Status: He is alert.     Comments: CN 3-12 grossly intact. 5/5 strength in all 4 extremities. Grossly normal sensation. Normal finger to nose.   Psychiatric:        Mood and Affect: Mood is not anxious.      ED Treatments / Results  Labs (all labs ordered are listed, but only abnormal results are displayed) Labs Reviewed  COMPREHENSIVE METABOLIC PANEL - Abnormal; Notable for the following components:      Result Value   Potassium 3.3 (*)    Creatinine, Ser 1.40 (*)    All other components within normal limits  CBC WITH DIFFERENTIAL/PLATELET  B. BURGDORFI ANTIBODIES  ROCKY MTN SPOTTED FVR ABS PNL(IGG+IGM)    EKG EKG Interpretation  Date/Time:  Saturday October 31 2018 18:07:28 EDT Ventricular Rate:  52 PR Interval:    QRS Duration: 98 QT Interval:  414 QTC Calculation: 385 R Axis:   69 Text Interpretation:  Sinus bradycardia Atrial premature complex ST elev, probable normal early repol pattern Baseline wander in lead(s) III aVL aVF Confirmed by Sherwood Gambler [phone]) on 10/31/2018 6:29:22 PM   Radiology Ct Head Wo Contrast  Result Date: 10/31/2018 CLINICAL DATA:  Acute severe headache, worst of life; headache, dizziness, blurred vision and nausea for 1 week EXAM: CT HEAD WITHOUT CONTRAST TECHNIQUE: Contiguous axial images were obtained from the base of the skull through the vertex  without intravenous contrast. COMPARISON:  08/14/2016 FINDINGS: Brain: Normal ventricular morphology. No midline shift or mass effect. Normal appearance of brain parenchyma. No intracranial hemorrhage, mass lesion or evidence of acute infarction. No extra-axial fluid collections. Vascular: Unremarkable Skull: Intact Sinuses/Orbits: Clear Other: N/A IMPRESSION: Normal exam. Electronically Signed   By: Lavonia Dana M.D.   On: 10/31/2018 18:51    Procedures Procedures (including critical care time)  Medications Ordered in  ED Medications  prochlorperazine (COMPAZINE) injection 10 mg (10 mg Intravenous Given 10/31/18 1816)  lactated ringers bolus 1,000 mL (0 mLs Intravenous Stopped 10/31/18 1922)     Initial Impression / Assessment and Plan / ED Course  I have reviewed the triage vital signs and the nursing notes.  Pertinent labs & imaging results that were available during my care of the patient were reviewed by me and considered in my medical decision making (see chart for details).        Headache has "98% resolved".  Given his report of blurry vision, head CT obtained but is negative.  He does not typically get headaches.  Given this started off pretty mild my suspicion of subarachnoid hemorrhage is fairly low.  His labs are overall quite reassuring including normal WBC, normal sodium and normal platelets.  While this would not totally rule out RMSF, I think the time course is a little quick from when he noticed the tick and took it off to starting off with a headache.  There has been no rash consistent with Lyme disease.  I have sent titers for these though my suspicion of CNS involvement or meningitis in any sort of way is very low.  I offered to treat with doxycycline given the titers will take a while to come back.  However at this point he does not want to start antibiotics but instead see how he does.  I think this is reasonable given lower suspicion.  However I discussed that if his headache  were to come back, he noticed of rash or fever, blurry vision comes back, or any other new/concerning symptoms then he should return to the ER for evaluation.  Final Clinical Impressions(s) / ED Diagnoses   Final diagnoses:  Bad headache    ED Discharge Orders    None       Pricilla LovelessGoldston, Evalyn Shultis, MD 10/31/18 2249

## 2018-10-31 NOTE — ED Triage Notes (Signed)
Pt presents with headache,dizziness, blurred vision and nausea x 1 week. Pt seen at urgent care on wed and received shot with no improvement.

## 2018-10-31 NOTE — Discharge Instructions (Signed)
If you develop continued, recurrent, or worsening headache, fever, neck stiffness, vomiting, blurry or double vision, weakness or numbness in your arms or legs, trouble speaking, a rash, or any other new/concerning symptoms then return to the ER for evaluation.

## 2018-10-31 NOTE — ED Notes (Signed)
Patient transported to CT 

## 2018-11-02 LAB — B. BURGDORFI ANTIBODIES: B burgdorferi Ab IgG+IgM: <0.91 {ISR} (ref 0.00–0.90)

## 2018-11-03 ENCOUNTER — Telehealth (HOSPITAL_COMMUNITY): Payer: Self-pay

## 2018-11-03 LAB — ROCKY MTN SPOTTED FVR ABS PNL(IGG+IGM)
RMSF IgG: NEGATIVE
RMSF IgM: 0.3 index (ref 0.00–0.89)

## 2018-11-29 ENCOUNTER — Encounter (HOSPITAL_BASED_OUTPATIENT_CLINIC_OR_DEPARTMENT_OTHER): Payer: Self-pay | Admitting: *Deleted

## 2018-11-29 ENCOUNTER — Emergency Department (HOSPITAL_BASED_OUTPATIENT_CLINIC_OR_DEPARTMENT_OTHER): Payer: BC Managed Care – PPO

## 2018-11-29 ENCOUNTER — Other Ambulatory Visit: Payer: Self-pay

## 2018-11-29 ENCOUNTER — Emergency Department (HOSPITAL_BASED_OUTPATIENT_CLINIC_OR_DEPARTMENT_OTHER)
Admission: EM | Admit: 2018-11-29 | Discharge: 2018-11-29 | Disposition: A | Discharge status: Home / Self Care | Payer: BC Managed Care – PPO | Attending: Emergency Medicine | Admitting: Emergency Medicine

## 2018-11-29 DIAGNOSIS — Y929 Unspecified place or not applicable: Secondary | ICD-10-CM | POA: Diagnosis not present

## 2018-11-29 DIAGNOSIS — Z79899 Other long term (current) drug therapy: Secondary | ICD-10-CM | POA: Insufficient documentation

## 2018-11-29 DIAGNOSIS — Z91010 Allergy to peanuts: Secondary | ICD-10-CM | POA: Insufficient documentation

## 2018-11-29 DIAGNOSIS — Y939 Activity, unspecified: Secondary | ICD-10-CM | POA: Diagnosis not present

## 2018-11-29 DIAGNOSIS — S20212A Contusion of left front wall of thorax, initial encounter: Secondary | ICD-10-CM | POA: Diagnosis not present

## 2018-11-29 DIAGNOSIS — W1789XA Other fall from one level to another, initial encounter: Secondary | ICD-10-CM | POA: Diagnosis not present

## 2018-11-29 DIAGNOSIS — J45909 Unspecified asthma, uncomplicated: Secondary | ICD-10-CM | POA: Diagnosis not present

## 2018-11-29 DIAGNOSIS — S29001A Unspecified injury of muscle and tendon of front wall of thorax, initial encounter: Secondary | ICD-10-CM | POA: Diagnosis present

## 2018-11-29 DIAGNOSIS — Y999 Unspecified external cause status: Secondary | ICD-10-CM | POA: Diagnosis not present

## 2018-11-29 MED ORDER — CYCLOBENZAPRINE HCL 10 MG PO TABS: 10.0000 mg | ORAL_TABLET | Freq: Two times a day (BID) | ORAL | 0 refills | Status: DC | PRN

## 2018-11-29 NOTE — ED Triage Notes (Signed)
Pt fell off his truck, landing on his left ribs.  Point tenderness. Denies hitting head or other pain. No bruising, obvious deformities noted.

## 2018-11-29 NOTE — ED Provider Notes (Signed)
[facility] EMERGENCY DEPARTMENT Provider Note   CSN: [id number] Arrival date & time: 11/29/18  1448     History   Chief Complaint Chief Complaint  Patient presents with  . Chest Pain    HPI Timothy Ball is a 23 y.o. male.     The history is provided by the patient.  Chest Pain Pain location:  L chest Pain quality: aching   Pain radiates to:  Does not radiate Pain severity:  Mild Onset quality:  Gradual Timing:  Constant Progression:  Unchanged Context: movement   Relieved by:  Nothing Worsened by:  Nothing Associated symptoms: no abdominal pain, no back pain, no cough, no fever, no palpitations, no shortness of breath and no vomiting   Risk factors: no high cholesterol and no hypertension     Past Medical History:  Diagnosis Date  . Asthma     There are no active problems to display for this patient.   Past Surgical History:  Procedure Laterality Date  . HIP SURGERY    . WISDOM TOOTH EXTRACTION          Home Medications    Prior to Admission medications   Medication Sig Start Date End Date Taking? Authorizing Provider  acetaminophen (TYLENOL) 500 MG tablet Take 1,000 mg by mouth as needed for mild pain.    [provider]  albuterol (PROVENTIL HFA;VENTOLIN HFA) 108 (90 Base) MCG/ACT inhaler Inhale 1 puff into the lungs every 6 (six) hours as needed for shortness of breath or wheezing. 02/06/17   [provider]  benzonatate (TESSALON) 100 MG capsule Take 1 capsule (100 mg total) by mouth every 8 (eight) hours. 07/31/18   Petrucelli, Samantha R, PA-C  busPIRone (BUSPAR) 15 MG tablet Take 15 mg by mouth 2 (two) times daily. 01/05/18   [provider]  cyclobenzaprine (FLEXERIL) 10 MG tablet Take 1 tablet (10 mg total) by mouth 2 (two) times daily as needed for up to 10 doses for muscle spasms. 11/29/18   Bennye Nix, DO  escitalopram (LEXAPRO) 10 MG tablet Take 1 tablet (10 mg total) by mouth daily. 01/08/18 02/07/18   Mortis, Jerrel IvoryGabrielle I, PA-C  fluticasone (FLONASE) 50 MCG/ACT nasal spray Place 1 spray into both nostrils daily. 07/31/18   Petrucelli, Samantha R, PA-C  naproxen (NAPROSYN) 500 MG tablet Take 1 tablet (500 mg total) by mouth 2 (two) times daily. 07/31/18   Petrucelli, Pleas KochSamantha R, PA-C    Family History History reviewed. No pertinent family history.  Social History Social History   Tobacco Use  . Smoking status: Never Smoker  . Smokeless tobacco: Never Used  Substance Use Topics  . Alcohol use: Yes    Comment: occ  . Drug use: No     Allergies   Buspirone, Peanut oil, Peanut-containing drug products, Shellfish allergy, Prednisone, Sulfites, and Benadryl [diphenhydramine]   Review of Systems Review of Systems  Constitutional: Negative for chills and fever.  HENT: Negative for ear pain and sore throat.   Eyes: Negative for pain and visual disturbance.  Respiratory: Negative for cough and shortness of breath.   Cardiovascular: Positive for chest pain. Negative for palpitations.  Gastrointestinal: Negative for abdominal pain and vomiting.  Genitourinary: Negative for dysuria and hematuria.  Musculoskeletal: Negative for arthralgias and back pain.  Skin: Negative for color change and rash.  Neurological: Negative for seizures and syncope.  All other systems reviewed and are negative.    Physical Exam Updated Vital Signs BP 98/67 (BP Location: Right  Arm)   Pulse 90   Temp 97.7 F (36.5 C) (Oral)   Resp 18   Ht 5\' 8"  (1.727 m)   Wt 61.2 kg   SpO2 99%   BMI 20.53 kg/m   Physical Exam Vitals signs and nursing note reviewed.  Constitutional:      Appearance: He is well-developed.  HENT:     Head: Normocephalic and atraumatic.  Eyes:     Conjunctiva/sclera: Conjunctivae normal.  Neck:     Musculoskeletal: Neck supple.  Cardiovascular:     Rate and Rhythm: Normal rate and regular rhythm.     Heart sounds: No murmur.  Pulmonary:     Effort: Pulmonary effort is  normal. No respiratory distress.     Breath sounds: Normal breath sounds. No decreased breath sounds, wheezing or rhonchi.  Chest:     Chest wall: Tenderness (left side of ribs) present.  Abdominal:     Palpations: Abdomen is soft.     Tenderness: There is no abdominal tenderness.  Skin:    General: Skin is warm and dry.  Neurological:     Mental Status: He is alert.      ED Treatments / Results  Labs (all labs ordered are listed, but only abnormal results are displayed) Labs Reviewed - No data to display  EKG None  Radiology Dg Chest 2 View  Result Date: 11/29/2018 CLINICAL DATA:  Left anterior lower rib pain post fall. EXAM: CHEST - 2 VIEW COMPARISON:  None. FINDINGS: The heart size and mediastinal contours are within normal limits. Both lungs are clear. The visualized skeletal structures are unremarkable. IMPRESSION: No active cardiopulmonary disease. Electronically Signed   By: Fidela Salisbury M.D.   On: 11/29/2018 15:24    Procedures Procedures (including critical care time)  Medications Ordered in ED Medications - No data to display   Initial Impression / Assessment and Plan / ED Course  I have reviewed the triage vital signs and the nursing notes.  Pertinent labs & imaging results that were available during my care of the patient were reviewed by me and considered in my medical decision making (see chart for details).        Timothy Ball is a 23 year old male who presents to the ED after hitting the left side of his ribs on his truck.  Patient with normal vitals.  Clear breath sounds.  X-ray shows no rib fractures. No pneumothorax. Likely bony contusion.  Discharged in ED in good condition.  Given Flexeril prescription.  Recommend Tylenol Motrin for pain.  This chart was dictated using voice recognition software.  Despite best efforts to proofread,  errors can occur which can change the documentation meaning.   Final Clinical Impressions(s) / ED  Diagnoses   Final diagnoses:  Contusion of rib on left side, initial encounter    ED Discharge Orders         Ordered    cyclobenzaprine (FLEXERIL) 10 MG tablet  2 times daily PRN     11/29/18 1537           Gerasimos Plotts, DO 11/29/18 1539

## 2018-11-29 NOTE — ED Notes (Signed)
Pt verbalized understanding of dc instructions.

## 2019-05-24 ENCOUNTER — Encounter (HOSPITAL_BASED_OUTPATIENT_CLINIC_OR_DEPARTMENT_OTHER): Payer: Self-pay | Admitting: Emergency Medicine

## 2019-05-24 ENCOUNTER — Emergency Department (HOSPITAL_BASED_OUTPATIENT_CLINIC_OR_DEPARTMENT_OTHER): Payer: BC Managed Care – PPO

## 2019-05-24 ENCOUNTER — Emergency Department (HOSPITAL_BASED_OUTPATIENT_CLINIC_OR_DEPARTMENT_OTHER)
Admission: EM | Admit: 2019-05-24 | Discharge: 2019-05-24 | Disposition: A | Discharge status: Home / Self Care | Payer: BC Managed Care – PPO | Attending: Emergency Medicine | Admitting: Emergency Medicine

## 2019-05-24 ENCOUNTER — Other Ambulatory Visit: Payer: Self-pay

## 2019-05-24 DIAGNOSIS — Z20822 Contact with and (suspected) exposure to covid-19: Secondary | ICD-10-CM | POA: Diagnosis not present

## 2019-05-24 DIAGNOSIS — Z91013 Allergy to seafood: Secondary | ICD-10-CM | POA: Insufficient documentation

## 2019-05-24 DIAGNOSIS — Z79899 Other long term (current) drug therapy: Secondary | ICD-10-CM | POA: Diagnosis not present

## 2019-05-24 DIAGNOSIS — R0602 Shortness of breath: Secondary | ICD-10-CM | POA: Insufficient documentation

## 2019-05-24 DIAGNOSIS — M7918 Myalgia, other site: Secondary | ICD-10-CM | POA: Diagnosis not present

## 2019-05-24 DIAGNOSIS — J45909 Unspecified asthma, uncomplicated: Secondary | ICD-10-CM | POA: Diagnosis not present

## 2019-05-24 DIAGNOSIS — Z888 Allergy status to other drugs, medicaments and biological substances status: Secondary | ICD-10-CM | POA: Diagnosis not present

## 2019-05-24 DIAGNOSIS — R05 Cough: Secondary | ICD-10-CM | POA: Diagnosis not present

## 2019-05-24 DIAGNOSIS — Z91010 Allergy to peanuts: Secondary | ICD-10-CM | POA: Insufficient documentation

## 2019-05-24 DIAGNOSIS — U071 COVID-19: Secondary | ICD-10-CM

## 2019-05-24 DIAGNOSIS — R509 Fever, unspecified: Secondary | ICD-10-CM | POA: Diagnosis present

## 2019-05-24 LAB — URINALYSIS, ROUTINE W REFLEX MICROSCOPIC
Bilirubin Urine: NEGATIVE
Glucose, UA: NEGATIVE mg/dL
Hgb urine dipstick: NEGATIVE
Ketones, ur: NEGATIVE mg/dL
Leukocytes,Ua: NEGATIVE
Nitrite: NEGATIVE
Protein, ur: NEGATIVE mg/dL
Specific Gravity, Urine: 1.02 (ref 1.005–1.030)
pH: 6 (ref 5.0–8.0)

## 2019-05-24 LAB — COMPREHENSIVE METABOLIC PANEL
ALT: 20 U/L (ref 0–44)
AST: 19 U/L (ref 15–41)
Albumin: 4.4 g/dL (ref 3.5–5.0)
Alkaline Phosphatase: 61 U/L (ref 38–126)
Anion gap: 11 (ref 5–15)
BUN: 23 mg/dL — ABNORMAL HIGH (ref 6–20)
CO2: 23 mmol/L (ref 22–32)
Calcium: 9.3 mg/dL (ref 8.9–10.3)
Chloride: 105 mmol/L (ref 98–111)
Creatinine, Ser: 0.98 mg/dL (ref 0.61–1.24)
GFR calc Af Amer: >60 mL/min (ref >60)
GFR calc non Af Amer: >60 mL/min (ref >60)
Glucose, Bld: 110 mg/dL — ABNORMAL HIGH (ref 70–99)
Potassium: 3.5 mmol/L (ref 3.5–5.1)
Sodium: 139 mmol/L (ref 135–145)
Total Bilirubin: 1.3 mg/dL — ABNORMAL HIGH (ref 0.3–1.2)
Total Protein: 7.4 g/dL (ref 6.5–8.1)

## 2019-05-24 LAB — CBC WITH DIFFERENTIAL/PLATELET
Abs Immature Granulocytes: 0.09 10*3/uL — ABNORMAL HIGH (ref 0.00–0.07)
Basophils Absolute: 0 10*3/uL (ref 0.0–0.1)
Basophils Relative: 0 %
Eosinophils Absolute: 0 10*3/uL (ref 0.0–0.5)
Eosinophils Relative: 0 %
HCT: 45.3 % (ref 39.0–52.0)
Hemoglobin: 15.3 g/dL (ref 13.0–17.0)
Immature Granulocytes: 0 %
Lymphocytes Relative: 3 %
Lymphs Abs: 0.6 10*3/uL — ABNORMAL LOW (ref 0.7–4.0)
MCH: 28.1 pg (ref 26.0–34.0)
MCHC: 33.8 g/dL (ref 30.0–36.0)
MCV: 83.1 fL (ref 80.0–100.0)
Monocytes Absolute: 1.1 10*3/uL — ABNORMAL HIGH (ref 0.1–1.0)
Monocytes Relative: 6 %
Neutro Abs: 18.4 10*3/uL — ABNORMAL HIGH (ref 1.7–7.7)
Neutrophils Relative %: 91 %
Platelets: 206 10*3/uL (ref 150–400)
RBC: 5.45 MIL/uL (ref 4.22–5.81)
RDW: 13.1 % (ref 11.5–15.5)
WBC: 20.3 10*3/uL — ABNORMAL HIGH (ref 4.0–10.5)
nRBC: 0 % (ref 0.0–0.2)

## 2019-05-24 LAB — PROTIME-INR
INR: 1 (ref 0.8–1.2)
Prothrombin Time: 13.3 seconds (ref 11.4–15.2)

## 2019-05-24 LAB — LACTIC ACID, PLASMA: Lactic Acid, Venous: 1.1 mmol/L (ref 0.5–1.9)

## 2019-05-24 LAB — SARS CORONAVIRUS 2 AG (30 MIN TAT): SARS Coronavirus 2 Ag: NEGATIVE

## 2019-05-24 MED ORDER — SODIUM CHLORIDE 0.9 % IV BOLUS (SEPSIS)
1000.0000 mL | Freq: Once | INTRAVENOUS | Status: AC
  Administered 2019-05-24: 14:00:00 1000 mL via INTRAVENOUS

## 2019-05-24 MED ORDER — SODIUM CHLORIDE 0.9 % IV BOLUS
1000.0000 mL | Freq: Once | INTRAVENOUS | Status: AC
  Administered 2019-05-24: 12:00:00 1000 mL via INTRAVENOUS

## 2019-05-24 MED ORDER — SODIUM CHLORIDE 0.9 % IV SOLN
500.0000 mg | INTRAVENOUS | Status: DC
  Administered 2019-05-24: 14:00:00 500 mg via INTRAVENOUS

## 2019-05-24 MED ORDER — SODIUM CHLORIDE 0.9 % IV BOLUS
1000.0000 mL | Freq: Once | INTRAVENOUS | Status: AC
  Administered 2019-05-24: 14:00:00 1000 mL via INTRAVENOUS

## 2019-05-24 MED ORDER — SODIUM CHLORIDE 0.9 % IV SOLN
INTRAVENOUS | Status: AC
  Filled 2019-05-24: qty 500

## 2019-05-24 MED ORDER — SODIUM CHLORIDE 0.9 % IV SOLN
2.0000 g | INTRAVENOUS | Status: DC
  Administered 2019-05-24: 14:00:00 2 g via INTRAVENOUS

## 2019-05-24 MED ORDER — SODIUM CHLORIDE 0.9 % IV SOLN
INTRAVENOUS | Status: AC
  Filled 2019-05-24: qty 20

## 2019-05-24 MED ORDER — IOHEXOL 300 MG/ML  SOLN
100.0000 mL | Freq: Once | INTRAMUSCULAR | Status: AC | PRN
  Administered 2019-05-24: 100 mL via INTRAVENOUS

## 2019-05-24 MED ORDER — SODIUM CHLORIDE 0.9 % IV BOLUS (SEPSIS)
1000.0000 mL | Freq: Once | INTRAVENOUS | Status: AC
  Administered 2019-05-24: 1000 mL via INTRAVENOUS

## 2019-05-24 NOTE — Discharge Instructions (Addendum)
Your laboratory results were remarkable for an elevated white count.  Your chest x-ray did not show any pneumonia.  Your CT of the abdomen was within normal limits.  You will need to continue monitor your fever at home, you may take Tylenol to help with the symptoms.  I encouraged bed rest for the next couple of days.    Follow up with your primary care physician as needed.  Although your Covid test was negative today, I suggest you go to a drive-through later on this week in order to be retested.  If you experience any shortness of breath, worsening symptoms you may return to the emergency department.

## 2019-05-24 NOTE — ED Triage Notes (Signed)
Pt states that he woke up this am with generalized aches, fever of 101.7 and cough. The pt states that he also is having SOB

## 2019-05-24 NOTE — ED Notes (Signed)
ED Provider at bedside. 

## 2019-05-24 NOTE — ED Notes (Signed)
Pt states he is feeling better.  

## 2019-05-24 NOTE — ED Notes (Signed)
Patient transported to CT 

## 2019-05-24 NOTE — ED Provider Notes (Signed)
[facility] EMERGENCY DEPARTMENT Provider Note   CSN: [id number] Arrival date & time: 05/24/19  1009     History Chief Complaint  Patient presents with  . Fever    r/o COVID    Timothy Ball is a 24 y.o. male.  24 y.o male with a PMH of Asthma presents to the ED with a chief complaint of fever, body aches of sudden onset this morning. Patient states his symptoms began this morning upon waking up consistent with body aches, fever, "burning eyes". Patient is currently employed at [employer] and reports several people have been covid + in the past week. He also endorses slight shortness of breath, this happens when he takes a deep breath along with some chest tightness.  He does report a dry cough which exacerbates the symptoms.  He has not taken any medication for improvement in his symptoms.  He does report a T-max of 101.2 while at home.  He denies any abdominal pain, urinary symptoms, headaches, prior surgical history to the abdomen.  No prior hospitalizations due to asthma exacerbation.    The history is provided by the patient and medical records.  Fever Associated symptoms: cough   Associated symptoms: no chest pain, no diarrhea, no headaches, no nausea, no sore throat and no vomiting        Past Medical History:  Diagnosis Date  . Asthma     There are no problems to display for this patient.   Past Surgical History:  Procedure Laterality Date  . HIP SURGERY    . WISDOM TOOTH EXTRACTION         History reviewed. No pertinent family history.  Social History   Tobacco Use  . Smoking status: Never Smoker  . Smokeless tobacco: Never Used  Substance Use Topics  . Alcohol use: Yes    Comment: occ  . Drug use: No    Home Medications Prior to Admission medications   Medication Sig Start Date End Date Taking? Authorizing Provider  escitalopram (LEXAPRO) 10 MG tablet Take 1 tablet (10 mg total) by mouth daily. 01/08/18 02/07/18  Mortis, Jerrel Ivory I,  PA-C    Allergies    Buspirone, Peanut oil, Peanut-containing drug products, Shellfish allergy, Prednisone, Sulfites, and Benadryl [diphenhydramine]  Review of Systems   Review of Systems  Constitutional: Positive for fever.  HENT: Negative for sore throat.   Respiratory: Positive for cough and chest tightness.   Cardiovascular: Negative for chest pain.  Gastrointestinal: Negative for abdominal pain, diarrhea, nausea and vomiting.  Genitourinary: Negative for flank pain.  Musculoskeletal: Negative for back pain.  Skin: Negative for pallor and wound.  Neurological: Negative for light-headedness and headaches.    Physical Exam Updated Vital Signs BP 105/77 (BP Location: Right Arm)   Pulse 87   Temp 99.3 F (37.4 C) (Oral)   Resp 19   Ht 5\' 8"  (1.727 m)   Wt 59 kg   SpO2 100%   BMI 19.77 kg/m   Physical Exam Vitals and nursing note reviewed.  Constitutional:      Appearance: Normal appearance.  HENT:     Head: Normocephalic and atraumatic.     Nose: Nose normal.     Mouth/Throat:     Mouth: Mucous membranes are moist.  Eyes:     Pupils: Pupils are equal, round, and reactive to light.  Cardiovascular:     Rate and Rhythm: Tachycardia present.  Pulmonary:     Effort: Pulmonary effort is normal.  Breath sounds: No wheezing, rhonchi or rales.  Abdominal:     General: Abdomen is flat. There is no distension.     Tenderness: There is no abdominal tenderness.  Musculoskeletal:     Cervical back: Normal range of motion and neck supple.     Right lower leg: No edema.     Left lower leg: No edema.  Skin:    General: Skin is warm and dry.  Neurological:     Mental Status: He is alert and oriented to person, place, and time.     ED Results / Procedures / Treatments   Labs (all labs ordered are listed, but only abnormal results are displayed) Labs Reviewed  COMPREHENSIVE METABOLIC PANEL - Abnormal; Notable for the following components:      Result Value    Glucose, Bld 110 (*)    BUN 23 (*)    Total Bilirubin 1.3 (*)    All other components within normal limits  CBC WITH DIFFERENTIAL/PLATELET - Abnormal; Notable for the following components:   WBC 20.3 (*)    Neutro Abs 18.4 (*)    Lymphs Abs 0.6 (*)    Monocytes Absolute 1.1 (*)    Abs Immature Granulocytes 0.09 (*)    All other components within normal limits  SARS CORONAVIRUS 2 AG (30 MIN TAT)  CULTURE, BLOOD (ROUTINE X 2)  CULTURE, BLOOD (ROUTINE X 2)  LACTIC ACID, PLASMA  PROTIME-INR  URINALYSIS, ROUTINE W REFLEX MICROSCOPIC  LACTIC ACID, PLASMA    EKG None  Radiology CT ABDOMEN PELVIS W CONTRAST  Result Date: 05/24/2019 CLINICAL DATA:  Abdominal pain with fevers, initial encounter EXAM: CT ABDOMEN AND PELVIS WITH CONTRAST TECHNIQUE: Multidetector CT imaging of the abdomen and pelvis was performed using the standard protocol following bolus administration of intravenous contrast. CONTRAST:  OMNIPAQUE IOHEXOL 300 MG/ML  SOLN COMPARISON:  None. FINDINGS: Lower chest: No acute abnormality. Hepatobiliary: No focal liver abnormality is seen. No gallstones, gallbladder wall thickening, or biliary dilatation. Pancreas: Unremarkable. No pancreatic ductal dilatation or surrounding inflammatory changes. Spleen: Normal in size without focal abnormality. Adrenals/Urinary Tract: Adrenal glands are unremarkable. Kidneys are normal, without renal calculi, focal lesion, or hydronephrosis. Bladder is unremarkable. Stomach/Bowel: The appendix is within normal limits. Colon and small bowel are fluid-filled although no true obstructive changes are seen. Stomach is decompressed. Vascular/Lymphatic: No significant vascular findings are present. No enlarged abdominal or pelvic lymph nodes. Reproductive: Prostate is unremarkable. Other: No abdominal wall hernia or abnormality. No abdominopelvic ascites. Musculoskeletal: Posterior right acetabular fixation is noted. No acute bony abnormality is seen.  IMPRESSION: Fluid-filled loops of large and small bowel which may be related to a diarrheal state. No focal abscess is seen. No other focal abnormality is noted. Electronically Signed   By: Alcide Clever M.D.   On: 05/24/2019 16:13   DG Chest Portable 1 View  Result Date: 05/24/2019 CLINICAL DATA:  Cough and fever EXAM: PORTABLE CHEST 1 VIEW COMPARISON:  November 29, 2018 FINDINGS: Lungs are clear. Heart size and pulmonary vascularity are normal. No adenopathy. There is upper thoracic levoscoliosis. IMPRESSION: No edema or consolidation.  No evident adenopathy. Electronically Signed   By: Bretta Bang III M.D.   On: 05/24/2019 11:14    Procedures Procedures (including critical care time)  Medications Ordered in ED Medications  cefTRIAXone (ROCEPHIN) 2 g in sodium chloride 0.9 % 100 mL IVPB (0 g Intravenous Stopped 05/24/19 1505)  azithromycin (ZITHROMAX) 500 mg in sodium chloride 0.9 % 250 mL IVPB (  0 mg Intravenous Stopped 05/24/19 1535)  sodium chloride 0.9 % bolus 1,000 mL (0 mLs Intravenous Stopped 05/24/19 1505)  sodium chloride 0.9 % bolus 1,000 mL (0 mLs Intravenous Stopped 05/24/19 1330)  sodium chloride 0.9 % bolus 1,000 mL (0 mLs Intravenous Stopped 05/24/19 1535)    And  sodium chloride 0.9 % bolus 1,000 mL (0 mLs Intravenous Stopped 05/24/19 1534)  iohexol (OMNIPAQUE) 300 MG/ML solution 100 mL (100 mLs Intravenous Contrast Given 05/24/19 1553)    ED Course  I have reviewed the triage vital signs and the nursing notes.  Pertinent labs & imaging results that were available during my care of the patient were reviewed by me and considered in my medical decision making (see chart for details).    MDM Rules/Calculators/A&P     Patient is a past medical history of asthma presents to the ED with complaints of sudden onset of fever, body aches which began this morning.  Patient is currently employed at the [employer], he reports several people have been sick throughout the past week.  Patient  arrived in the ED with a fever of 100.8, he does report a T-max of 101.2 while at home, has not taken anything for his symptoms.  Tachycardia in the 119's, hypotension with a pressure of 88/58, satting at 97% on room air.  During my evaluation patient is overall well-appearing, does appear somewhat dry.  Is able to speak in full sentences, lungs are clear to auscultation.  CBC was remarkable for a white blood cell count of 20 K.  No signs of anemia.  CMP without any electrolyte normality, kidney function is unremarkable, LFTs are within normal limits.  First lactic acid is negative.  UA showed no signs of infection.  A rapid Covid screening was obtained which was negative at this time.  X-ray of his chest did not show any consolidation, pneumothorax, pleural effusion.  High suspicion for COVID-19 infection, patient is currently being resuscitated, with approximately 3 L bolus.  He is also covered with broad-spectrum antibiotics for community-acquired pneumonia.  Will reevaluate patient prior to disposition.  Patient has received 3 L of IV normal saline, hypotension has resolved now has a pressure of 105/77, he does report yesterday she was cutting COVID-19 metal with a respirator, states he felt like this respirator was likely expired, he shows concern for that will fume poisoning. Patient was reasessed now reports pain along his abdomen, states this occurred after receiving IV ABX, has had no diarrhea, no vomiting no nausea. Serial abdominal exams done,no surgical abdomen on my exam.   CT Abdomen/Pelvis: Fluid-filled loops of large and small bowel which may be related to  a diarrheal state. No focal abscess is seen. No other focal  abnormality is noted.   4:31 PM Spoke to [facility], if patient had a defective respiratory less likely having metal fume fever.Hypotension does not fit the criteria, will monitor symptoms at home.  These findings were reviewed with patient at length, he is  encouraged to push fluids, treat his fever with Tylenol at home.  He understands and agrees with management, return precautions discussed at length.   Portions of this note were generated with Scientist, clinical (histocompatibility and immunogenetics). Dictation errors may occur despite best attempts at proofreading.  Final Clinical Impression(s) / ED Diagnoses Final diagnoses:  COVID-19 virus infection    Rx / DC Orders ED Discharge Orders    None       Claude Manges, PA-C 05/24/19 1642  Dorie Rank, MD 05/25/19 [phone]

## 2019-05-29 LAB — CULTURE, BLOOD (ROUTINE X 2)
Culture: NO GROWTH
Culture: NO GROWTH
Special Requests: ADEQUATE
Special Requests: ADEQUATE

## 2020-07-19 ENCOUNTER — Emergency Department (INDEPENDENT_AMBULATORY_CARE_PROVIDER_SITE_OTHER): Payer: BC Managed Care – PPO

## 2020-07-19 ENCOUNTER — Encounter: Payer: Self-pay | Admitting: Emergency Medicine

## 2020-07-19 ENCOUNTER — Other Ambulatory Visit: Payer: Self-pay

## 2020-07-19 ENCOUNTER — Emergency Department
Admission: EM | Admit: 2020-07-19 | Discharge: 2020-07-19 | Disposition: A | Discharge status: Home / Self Care | Payer: BC Managed Care – PPO | Source: Home / Self Care | Attending: Family Medicine | Admitting: Family Medicine

## 2020-07-19 DIAGNOSIS — R0781 Pleurodynia: Secondary | ICD-10-CM

## 2020-07-19 DIAGNOSIS — S2231XA Fracture of one rib, right side, initial encounter for closed fracture: Secondary | ICD-10-CM | POA: Diagnosis not present

## 2020-07-19 NOTE — ED Provider Notes (Signed)
[facility]    CSN: [id number] Arrival date & time: 07/19/20  1842      History   Chief Complaint Chief Complaint  Patient presents with  . Rib/Side Pain    HPI DOIL KAMARA is a 25 y.o. male.   HPI  Patient states that he fell off of the top of his truck and hit his ribs on the open door.  He then fell to the ground.  Has had pain in his rib ever since.  Its been for the last couple of days.  He has pain with deep breath breath and with movement.  No shortness of breath.  No cough or fever.  Past Medical History:  Diagnosis Date  . Asthma     There are no problems to display for this patient.   Past Surgical History:  Procedure Laterality Date  . HIP SURGERY    . WISDOM TOOTH EXTRACTION         Home Medications    Prior to Admission medications   Medication Sig Start Date End Date Taking? Authorizing Provider  escitalopram (LEXAPRO) 10 MG tablet Take 1 tablet (10 mg total) by mouth daily. 01/08/18 02/07/18  Mortis, Sharyon Medicus, PA-C    Family History No family history on file.  Social History Social History   Tobacco Use  . Smoking status: Never Smoker  . Smokeless tobacco: Never Used  Vaping Use  . Vaping Use: Never used  Substance Use Topics  . Alcohol use: Yes    Comment: occ  . Drug use: No     Allergies   Buspirone, Peanut oil, Peanut-containing drug products, Shellfish allergy, Prednisone, Sulfites, and Benadryl [diphenhydramine]   Review of Systems Review of Systems  See HPI Physical Exam Triage Vital Signs ED Triage Vitals [07/19/20 1907]  Enc Vitals Group     BP 119/72     Pulse Rate 65     Resp 16     Temp      Temp src      SpO2 98 %     Weight      Height      Head Circumference      Peak Flow      Pain Score 6     Pain Loc      Pain Edu?      Excl. in GC?    No data found.  Updated Vital Signs BP 119/72 (BP Location: Left Arm)   Pulse 65   SpO2 98%     Physical Exam Constitutional:       General: He is not in acute distress.    Appearance: He is well-developed and well-nourished.  HENT:     Head: Normocephalic and atraumatic.     Mouth/Throat:     Mouth: Oropharynx is clear and moist.  Eyes:     Conjunctiva/sclera: Conjunctivae normal.     Pupils: Pupils are equal, round, and reactive to light.  Cardiovascular:     Rate and Rhythm: Normal rate.  Pulmonary:     Effort: Pulmonary effort is normal. No respiratory distress.     Breath sounds: Normal breath sounds.    Abdominal:     General: There is no distension.     Palpations: Abdomen is soft.  Musculoskeletal:        General: No edema. Normal range of motion.     Cervical back: Normal range of motion.  Skin:    General: Skin is warm and dry.  Neurological:     Mental Status: He is alert.  Psychiatric:        Behavior: Behavior normal.      UC Treatments / Results  Labs (all labs ordered are listed, but only abnormal results are displayed) Labs Reviewed - No data to display  EKG   Radiology DG Ribs Unilateral W/Chest Right  Result Date: 07/19/2020 CLINICAL DATA:  Larey Seat, right lower rib trauma 5 days ago EXAM: RIGHT RIBS AND CHEST - 3+ VIEW COMPARISON:  05/24/2019 FINDINGS: Frontal view of the chest as well as frontal and oblique views of the right thoracic cage are obtained. Cardiac silhouette is unremarkable. No airspace disease, effusion, or pneumothorax. There is a minimally displaced right anterior tenth rib fracture at the costochondral junction. No other acute bony abnormalities. IMPRESSION: 1. Minimally displaced right anterior tenth rib fracture at the costochondral junction. 2. No acute intrathoracic process. Electronically Signed   By: Sharlet Salina M.D.   On: 07/19/2020 19:41    Procedures Procedures (including critical care time)  Medications Ordered in UC Medications - No data to display  Initial Impression / Assessment and Plan / UC Course  I have reviewed the triage vital signs and  the nursing notes.  Pertinent labs & imaging results that were available during my care of the patient were reviewed by me and considered in my medical decision making (see chart for details).     Reviewed the importance of deep breath.  Reviewed preventing pneumonia.  Return if worse instead of better.  Slow recovery over 6 to 8 weeks Final Clinical Impressions(s) / UC Diagnoses   Final diagnoses:  Closed fracture of one rib of right side, initial encounter     Discharge Instructions     Take OTC medicine for pain Watch for pneumonia   ED Prescriptions    None     PDMP not reviewed this encounter.   Eustace Moore, MD 07/19/20 2003

## 2020-07-19 NOTE — ED Triage Notes (Signed)
Patient fell off the top of his truck onto to the passenger side door.  Patient is having right sided rib pain.  Hurts to sit, lay down, breathing, patient taken Tylenol for discomfort.

## 2020-07-19 NOTE — Discharge Instructions (Signed)
Take OTC medicine for pain Watch for pneumonia

## 2021-07-16 ENCOUNTER — Encounter (HOSPITAL_BASED_OUTPATIENT_CLINIC_OR_DEPARTMENT_OTHER): Payer: Self-pay

## 2021-07-16 ENCOUNTER — Other Ambulatory Visit: Payer: Self-pay

## 2021-07-16 ENCOUNTER — Emergency Department (HOSPITAL_BASED_OUTPATIENT_CLINIC_OR_DEPARTMENT_OTHER): Payer: BC Managed Care – PPO

## 2021-07-16 ENCOUNTER — Emergency Department (HOSPITAL_BASED_OUTPATIENT_CLINIC_OR_DEPARTMENT_OTHER)
Admission: EM | Admit: 2021-07-16 | Discharge: 2021-07-16 | Disposition: A | Discharge status: Home / Self Care | Payer: BC Managed Care – PPO | Attending: Emergency Medicine | Admitting: Emergency Medicine

## 2021-07-16 DIAGNOSIS — R1031 Right lower quadrant pain: Secondary | ICD-10-CM

## 2021-07-16 DIAGNOSIS — Z91010 Allergy to peanuts: Secondary | ICD-10-CM | POA: Diagnosis not present

## 2021-07-16 LAB — CBC
HCT: 46.5 % (ref 39.0–52.0)
Hemoglobin: 16 g/dL (ref 13.0–17.0)
MCH: 28.4 pg (ref 26.0–34.0)
MCHC: 34.4 g/dL (ref 30.0–36.0)
MCV: 82.6 fL (ref 80.0–100.0)
Platelets: 209 10*3/uL (ref 150–400)
RBC: 5.63 MIL/uL (ref 4.22–5.81)
RDW: 13.2 % (ref 11.5–15.5)
WBC: 5.6 10*3/uL (ref 4.0–10.5)
nRBC: 0 % (ref 0.0–0.2)

## 2021-07-16 LAB — COMPREHENSIVE METABOLIC PANEL
ALT: 21 U/L (ref 0–44)
AST: 23 U/L (ref 15–41)
Albumin: 4.3 g/dL (ref 3.5–5.0)
Alkaline Phosphatase: 64 U/L (ref 38–126)
Anion gap: 7 (ref 5–15)
BUN: 18 mg/dL (ref 6–20)
CO2: 25 mmol/L (ref 22–32)
Calcium: 9.1 mg/dL (ref 8.9–10.3)
Chloride: 105 mmol/L (ref 98–111)
Creatinine, Ser: 0.95 mg/dL (ref 0.61–1.24)
GFR, Estimated: >60 mL/min (ref >60)
Glucose, Bld: 94 mg/dL (ref 70–99)
Potassium: 3.7 mmol/L (ref 3.5–5.1)
Sodium: 137 mmol/L (ref 135–145)
Total Bilirubin: 0.8 mg/dL (ref 0.3–1.2)
Total Protein: 7.8 g/dL (ref 6.5–8.1)

## 2021-07-16 LAB — URINALYSIS, ROUTINE W REFLEX MICROSCOPIC
Bilirubin Urine: NEGATIVE
Glucose, UA: NEGATIVE mg/dL
Hgb urine dipstick: NEGATIVE
Ketones, ur: NEGATIVE mg/dL
Leukocytes,Ua: NEGATIVE
Nitrite: NEGATIVE
Protein, ur: NEGATIVE mg/dL
Specific Gravity, Urine: 1.015 (ref 1.005–1.030)
pH: 7.5 (ref 5.0–8.0)

## 2021-07-16 LAB — LIPASE, BLOOD: Lipase: 41 U/L (ref 11–51)

## 2021-07-16 MED ORDER — IOHEXOL 300 MG/ML  SOLN
100.0000 mL | Freq: Once | INTRAMUSCULAR | Status: AC | PRN
  Administered 2021-07-16: 100 mL via INTRAVENOUS

## 2021-07-16 MED ORDER — KETOROLAC TROMETHAMINE 15 MG/ML IJ SOLN
15.0000 mg | Freq: Once | INTRAMUSCULAR | Status: DC
  Filled 2021-07-16: qty 1

## 2021-07-16 NOTE — Discharge Instructions (Signed)
Follow-up with your primary care doctor.  Take Tylenol Motrin as needed for pain.  If you develop uncontrolled pain, vomiting, fever or other new concerning symptom, come back to ER for reassessment.

## 2021-07-16 NOTE — ED Triage Notes (Addendum)
Pt c/o right side abd pain/flank pain x today-states last time he had similar pain he was seen and advised "it was my gallbladder"-NAD-steady gait

## 2021-07-17 NOTE — ED Provider Notes (Signed)
[facility] EMERGENCY DEPARTMENT Provider Note   CSN: [id number] Arrival date & time: 07/16/21  1901     History  Chief Complaint  Patient presents with   Abdominal Pain    Timothy Ball is a 26 y.o. male.  Presents here with concern for right lower quadrant abdominal pain.  Pain ongoing for 1 day.  pain constant, no associated symptoms, no vomiting.  no chills or fevers.  No constipation or diarrhea.  He denies any chronic medical problems.  He denies any testicle pain or testicle swelling.  HPI     Home Medications Prior to Admission medications   Medication Sig Start Date End Date Taking? Authorizing Provider  escitalopram (LEXAPRO) 10 MG tablet Take 1 tablet (10 mg total) by mouth daily. 01/08/18 02/07/18  Mortis, Alvie Heidelberg I, PA-C      Allergies    Buspirone, Peanut oil, Peanut-containing drug products, Shellfish allergy, Prednisone, Sulfites, and Benadryl [diphenhydramine]    Review of Systems   Review of Systems  Constitutional:  Negative for chills and fever.  HENT:  Negative for ear pain and sore throat.   Eyes:  Negative for pain and visual disturbance.  Respiratory:  Negative for cough and shortness of breath.   Cardiovascular:  Negative for chest pain and palpitations.  Gastrointestinal:  Positive for abdominal pain. Negative for vomiting.  Genitourinary:  Negative for dysuria and hematuria.  Musculoskeletal:  Negative for arthralgias and back pain.  Skin:  Negative for color change and rash.  Neurological:  Negative for seizures and syncope.  All other systems reviewed and are negative.  Physical Exam Updated Vital Signs BP 122/75    Pulse 66    Temp 98.3 F (36.8 C) (Oral)    Resp 16    Ht 5\' 8"  (1.727 m)    Wt 68.5 kg    SpO2 99%    BMI 22.96 kg/m  Physical Exam Vitals and nursing note reviewed.  Constitutional:      General: He is not in acute distress.    Appearance: He is well-developed.  HENT:     Head: Normocephalic and atraumatic.   Eyes:     Conjunctiva/sclera: Conjunctivae normal.  Cardiovascular:     Rate and Rhythm: Normal rate and regular rhythm.     Heart sounds: No murmur heard. Pulmonary:     Effort: Pulmonary effort is normal. No respiratory distress.     Breath sounds: Normal breath sounds.  Abdominal:     Palpations: Abdomen is soft.     Tenderness: There is abdominal tenderness in the right lower quadrant.  Genitourinary:    Comments: Dorris Fetch -grossly normal-appearing testicles, no swelling or tenderness to the testicle, no hernia noted Musculoskeletal:        General: No swelling.     Cervical back: Neck supple.  Skin:    General: Skin is warm and dry.     Capillary Refill: Capillary refill takes less than 2 seconds.  Neurological:     Mental Status: He is alert.  Psychiatric:        Mood and Affect: Mood normal.    ED Results / Procedures / Treatments   Labs (all labs ordered are listed, but only abnormal results are displayed) Labs Reviewed  LIPASE, BLOOD  COMPREHENSIVE METABOLIC PANEL  CBC  URINALYSIS, ROUTINE W REFLEX MICROSCOPIC    EKG None  Radiology CT ABDOMEN PELVIS W CONTRAST  Result Date: 07/16/2021 CLINICAL DATA:  Right lower quadrant abdominal pain. EXAM: CT  ABDOMEN AND PELVIS WITH CONTRAST TECHNIQUE: Multidetector CT imaging of the abdomen and pelvis was performed using the standard protocol following bolus administration of intravenous contrast. RADIATION DOSE REDUCTION: This exam was performed according to the departmental dose-optimization program which includes automated exposure control, adjustment of the mA and/or kV according to patient size and/or use of iterative reconstruction technique. CONTRAST:  160mL OMNIPAQUE IOHEXOL 300 MG/ML  SOLN COMPARISON:  CT abdomen pelvis dated 05/24/2019. FINDINGS: Lower chest: The visualized lung bases are clear. No intra-abdominal free air or free fluid. Hepatobiliary: No focal liver abnormality is seen. No gallstones,  gallbladder wall thickening, or biliary dilatation. Pancreas: Unremarkable. No pancreatic ductal dilatation or surrounding inflammatory changes. Spleen: Normal in size without focal abnormality. Adrenals/Urinary Tract: Adrenal glands are unremarkable. Kidneys are normal, without renal calculi, focal lesion, or hydronephrosis. Bladder is unremarkable. Stomach/Bowel: There is no bowel obstruction or active inflammation. The appendix is normal. Vascular/Lymphatic: The abdominal aorta and IVC are unremarkable. No portal venous gas. There is no adenopathy. Reproductive: The prostate and seminal vesicles are grossly unremarkable. No pelvic mass. Other: None Musculoskeletal: Fixation plate and screws of the right ischium. No acute osseous pathology. IMPRESSION: No acute intra-abdominal or pelvic pathology. Normal appendix. Electronically Signed   By: Anner Crete M.D.   On: 07/16/2021 22:32    Procedures Procedures    Medications Ordered in ED Medications  iohexol (OMNIPAQUE) 300 MG/ML solution 100 mL (100 mLs Intravenous Contrast Given 07/16/21 2201)    ED Course/ Medical Decision Making/ A&P                           Medical Decision Making Amount and/or Complexity of Data Reviewed Labs: ordered. Radiology: ordered.  Risk Prescription drug management.   26 year old male with right lower quadrant abdominal pain.  On exam well-appearing in no distress, did note some tenderness in his right lower quadrant.  Check GU exam, unremarkable.  basic labs normal, no anemia or electrolyte derangement.  CT abdomen pelvis independently reviewed.  No acute findings per radiologist.    After the discussed management above, the patient was determined to be safe for discharge.  The patient was in agreement with this plan and all questions regarding their care were answered.  ED return precautions were discussed and the patient will return to the ED with any significant worsening of  condition.         Final Clinical Impression(s) / ED Diagnoses Final diagnoses:  RLQ abdominal pain    Rx / DC Orders ED Discharge Orders     None         Lucrezia Starch, MD 07/17/21 [phone]

## 2023-04-24 IMAGING — CT CT ABD-PELV W/ CM
2 of 4 series · 15 of 46 positions shown, 17 images · IV contrast (agent unspecified)
Comparison: CT abdomen pelvis dated 05/24/2019.

CLINICAL DATA: Right lower quadrant abdominal pain.

EXAM:
CT ABDOMEN AND PELVIS WITH CONTRAST
TECHNIQUE: Multidetector CT imaging of the abdomen and pelvis was performed
using the standard protocol following bolus administration of
intravenous contrast.

[Series 2: axial (person_name) (person_name) · axial · 0.76mm/px · z∈[-557,-137]mm · 12 of 93 slices shown, 14 images]
[im 5/93  soft-tissue]
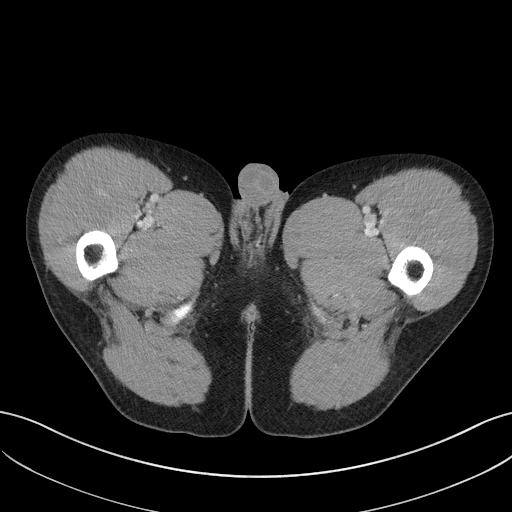
[im 5/93  bone]
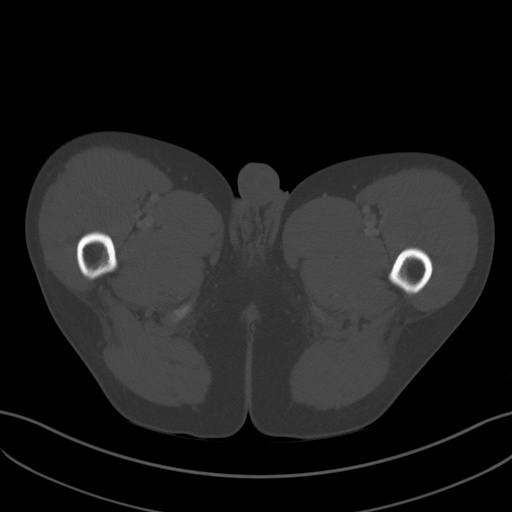
[im 13/93  soft-tissue]
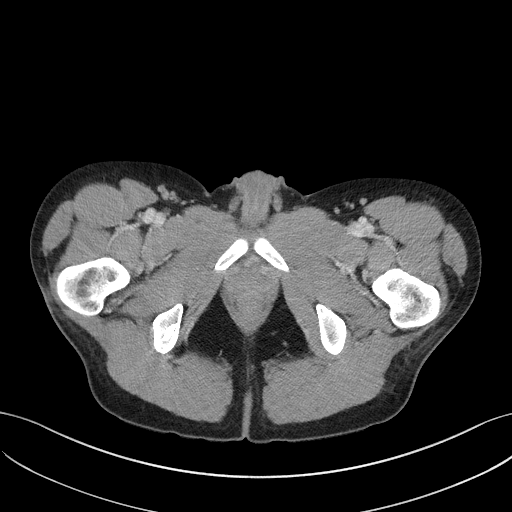
[im 21/93  soft-tissue]
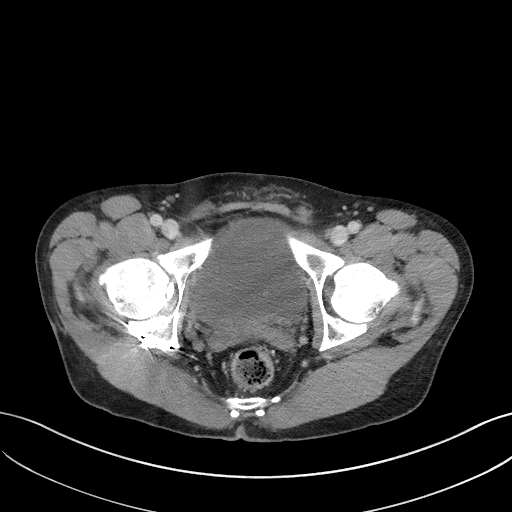
[im 29/93  soft-tissue]
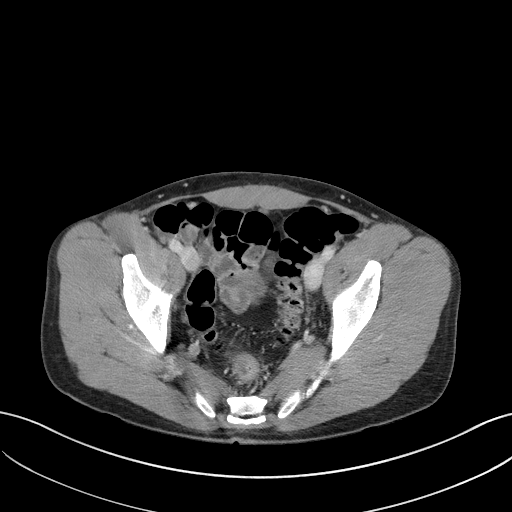
[im 37/93  soft-tissue]
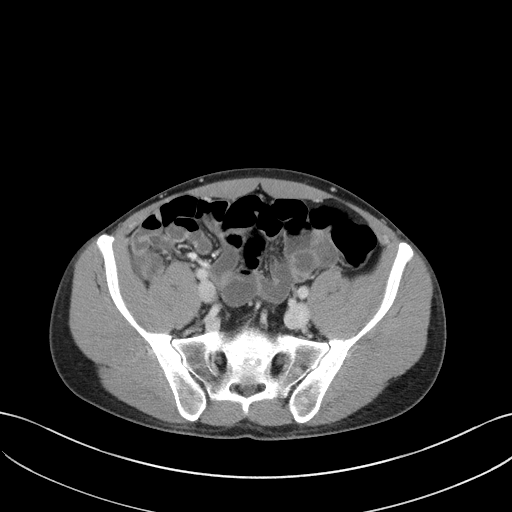
[im 45/93  soft-tissue]
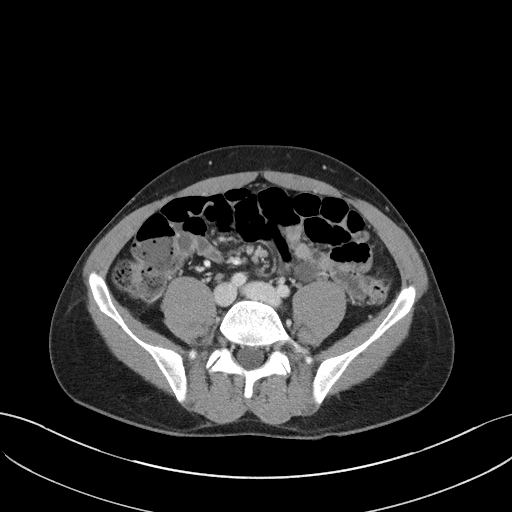
[im 49/93  soft-tissue]
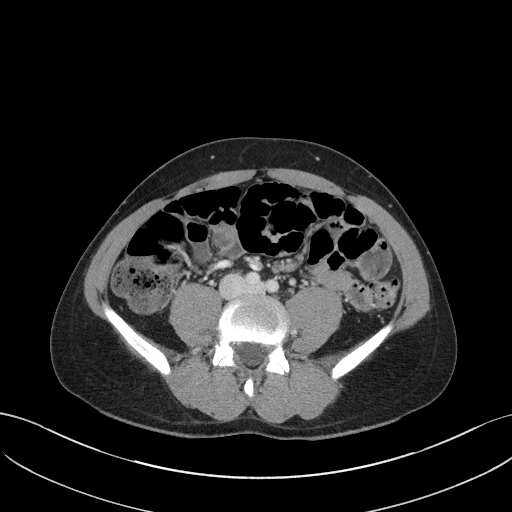
[im 57/93  soft-tissue]
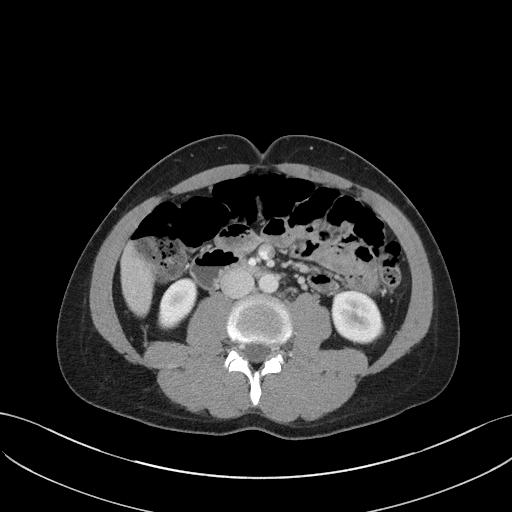
[im 65/93  soft-tissue]
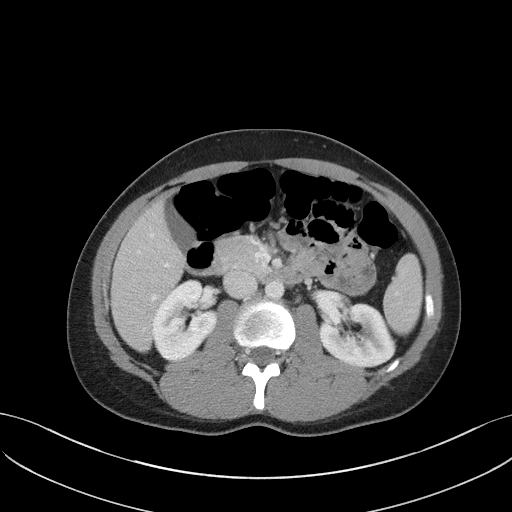
[im 65/93  bone]
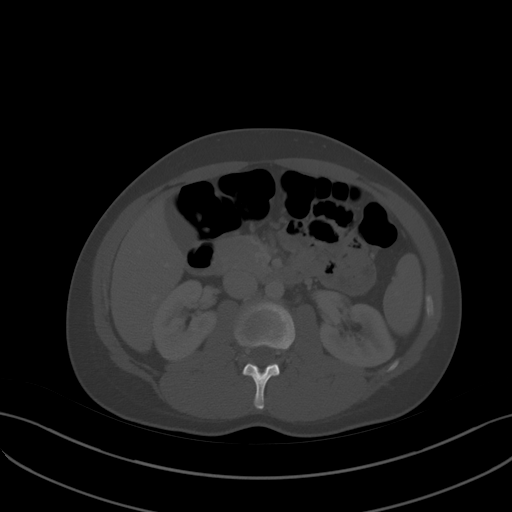
[im 73/93  soft-tissue]
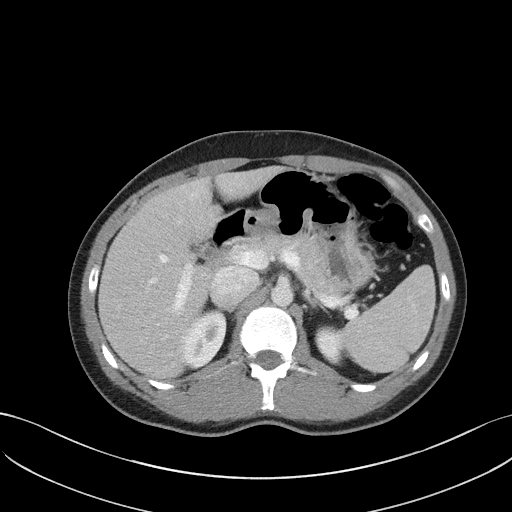
[im 81/93  soft-tissue]
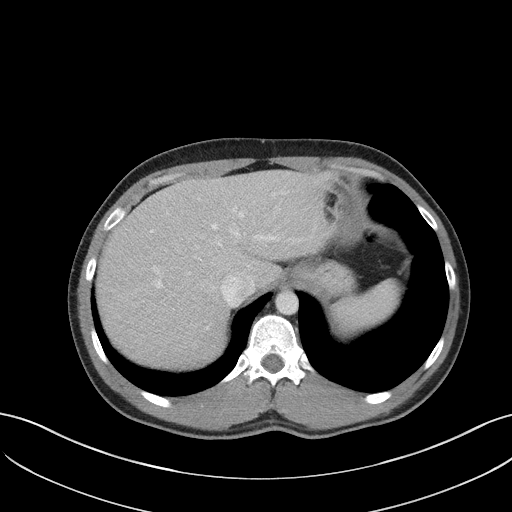
[im 89/93  soft-tissue]
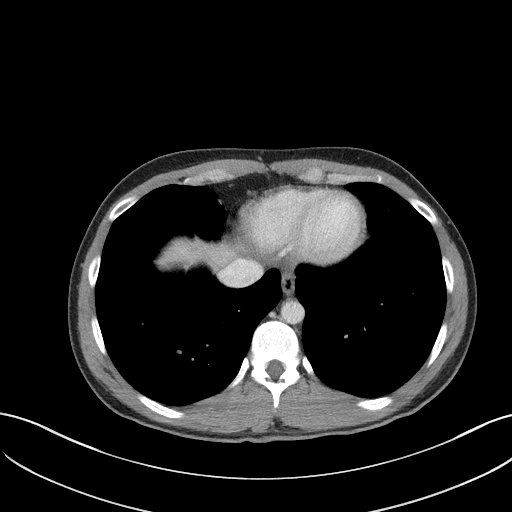

[Series 5: coronal st · coronal · 0.63mm/px · 3 of 101 slices shown]
[im 34/101  soft-tissue]
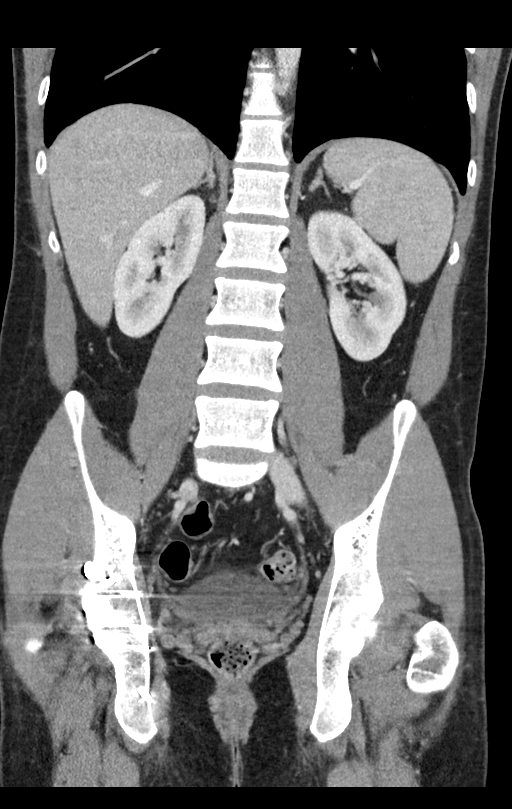
[im 45/101  soft-tissue]
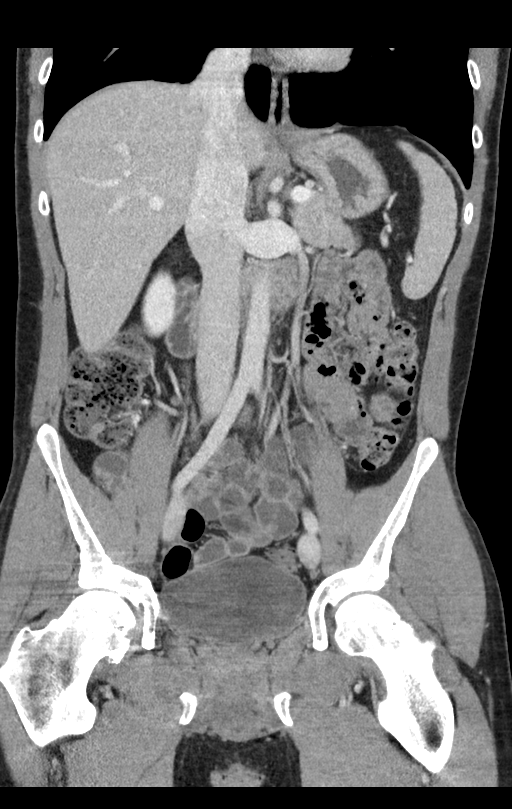
[im 56/101  soft-tissue]
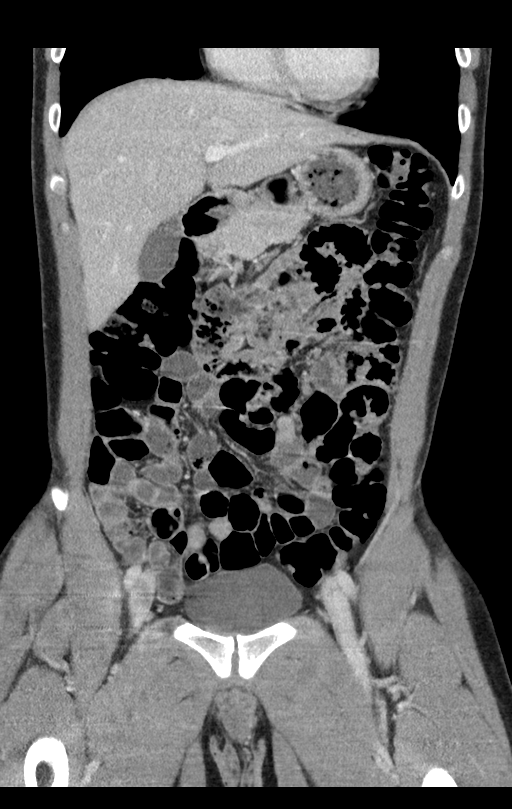

[15 of 46 positions shown; findings below may reference images not displayed]

RADIATION DOSE REDUCTION: This exam was performed according to the
departmental dose-optimization program which includes automated
exposure control, adjustment of the mA and/or kV according to
patient size and/or use of iterative reconstruction technique.

CONTRAST:  100mL OMNIPAQUE IOHEXOL 300 MG/ML  SOLN
FINDINGS: Lower chest: The visualized lung bases are clear.

No intra-abdominal free air or free fluid.

Hepatobiliary: No focal liver abnormality is seen. No gallstones,
gallbladder wall thickening, or biliary dilatation.

Pancreas: Unremarkable. No pancreatic ductal dilatation or
surrounding inflammatory changes.

Spleen: Normal in size without focal abnormality.

Adrenals/Urinary Tract: Adrenal glands are unremarkable. Kidneys are
normal, without renal calculi, focal lesion, or hydronephrosis.
Bladder is unremarkable.

Stomach/Bowel: There is no bowel obstruction or active inflammation.
The appendix is normal.

Vascular/Lymphatic: The abdominal aorta and IVC are unremarkable. No
portal venous gas. There is no adenopathy.

Reproductive: The prostate and seminal vesicles are grossly
unremarkable. No pelvic mass.

Other: None

Musculoskeletal: Fixation plate and screws of the right ischium. No
acute osseous pathology.
IMPRESSION: No acute intra-abdominal or pelvic pathology. Normal appendix.
